# Patient Record
Sex: Female | Born: 1940 | Race: White | Hispanic: No | Smoking: Never smoker
Health system: Southern US, Community
[De-identification: ages and names within clinical notes are randomized; demographics above are authoritative.]

## PROBLEM LIST (undated history)

## (undated) DIAGNOSIS — G43909 Migraine, unspecified, not intractable, without status migrainosus: Secondary | ICD-10-CM

## (undated) DIAGNOSIS — R519 Headache, unspecified: Secondary | ICD-10-CM

## (undated) DIAGNOSIS — R51 Headache: Secondary | ICD-10-CM

## (undated) HISTORY — PX: CATARACT EXTRACTION: SUR2

## (undated) HISTORY — DX: Migraine, unspecified, not intractable, without status migrainosus: G43.909

## (undated) HISTORY — DX: Headache: R51

## (undated) HISTORY — DX: Headache, unspecified: R51.9

---
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
## 1999-04-17 ENCOUNTER — Other Ambulatory Visit: Admission: RE | Admit: 1999-04-17 | Discharge: 1999-04-17 | Payer: Self-pay | Admitting: Family Medicine

## 2014-04-29 ENCOUNTER — Emergency Department: Payer: Self-pay | Admitting: Emergency Medicine

## 2014-04-29 LAB — CBC WITH DIFFERENTIAL/PLATELET
BASOS ABS: 0 10*3/uL (ref 0.0–0.1)
BASOS PCT: 0.1 %
EOS PCT: 0.6 %
Eosinophil #: 0.1 10*3/uL (ref 0.0–0.7)
HCT: 36.2 % (ref 35.0–47.0)
HGB: 12.2 g/dL (ref 12.0–16.0)
Lymphocyte #: 0.7 10*3/uL — ABNORMAL LOW (ref 1.0–3.6)
Lymphocyte %: 8.5 %
MCH: 31.6 pg (ref 26.0–34.0)
MCHC: 33.7 g/dL (ref 32.0–36.0)
MCV: 94 fL (ref 80–100)
MONO ABS: 0.4 x10 3/mm (ref 0.2–0.9)
Monocyte %: 4.6 %
Neutrophil #: 7.5 10*3/uL — ABNORMAL HIGH (ref 1.4–6.5)
Neutrophil %: 86.2 %
Platelet: 250 10*3/uL (ref 150–440)
RBC: 3.86 10*6/uL (ref 3.80–5.20)
RDW: 13.2 % (ref 11.5–14.5)
WBC: 8.7 10*3/uL (ref 3.6–11.0)

## 2014-04-29 LAB — COMPREHENSIVE METABOLIC PANEL
AST: 21 U/L (ref 15–37)
Albumin: 3.5 g/dL (ref 3.4–5.0)
Alkaline Phosphatase: 73 U/L
Anion Gap: 6 — ABNORMAL LOW (ref 7–16)
BUN: 10 mg/dL (ref 7–18)
Bilirubin,Total: 0.4 mg/dL (ref 0.2–1.0)
CALCIUM: 8.8 mg/dL (ref 8.5–10.1)
CREATININE: 0.73 mg/dL (ref 0.60–1.30)
Chloride: 101 mmol/L (ref 98–107)
Co2: 23 mmol/L (ref 21–32)
EGFR (Non-African Amer.): >60
Glucose: 125 mg/dL — ABNORMAL HIGH (ref 65–99)
Osmolality: 261 (ref 275–301)
Potassium: 4 mmol/L (ref 3.5–5.1)
SGPT (ALT): 25 U/L (ref 12–78)
Sodium: 130 mmol/L — ABNORMAL LOW (ref 136–145)
TOTAL PROTEIN: 6.7 g/dL (ref 6.4–8.2)

## 2014-04-29 LAB — MAGNESIUM: MAGNESIUM: 1.7 mg/dL — AB

## 2014-04-29 LAB — SEDIMENTATION RATE: ERYTHROCYTE SED RATE: 7 mm/h (ref 0–30)

## 2014-04-29 LAB — LIPASE, BLOOD: LIPASE: 147 U/L (ref 73–393)

## 2014-04-30 LAB — URINALYSIS, COMPLETE
Bilirubin,UR: NEGATIVE
GLUCOSE, UR: NEGATIVE mg/dL (ref 0–75)
NITRITE: NEGATIVE
Ph: 6 (ref 4.5–8.0)
Protein: 30
SPECIFIC GRAVITY: 1.013 (ref 1.003–1.030)

## 2015-04-21 IMAGING — CT CT HEAD WITHOUT CONTRAST
1 series · 16 of 28 positions shown, 20 images · non-contrast
Comparison: None.

CLINICAL DATA: 72-year-old female with severe headache.

EXAM:
CT HEAD WITHOUT CONTRAST
TECHNIQUE: Contiguous axial images were obtained from the base of the skull
through the vertex without intravenous contrast.

[Series 2: head wo · axial · 0.42mm/px · z∈[-179,-66]mm · 16 of 28 slices shown, 20 images]
[im 2/28  brain]
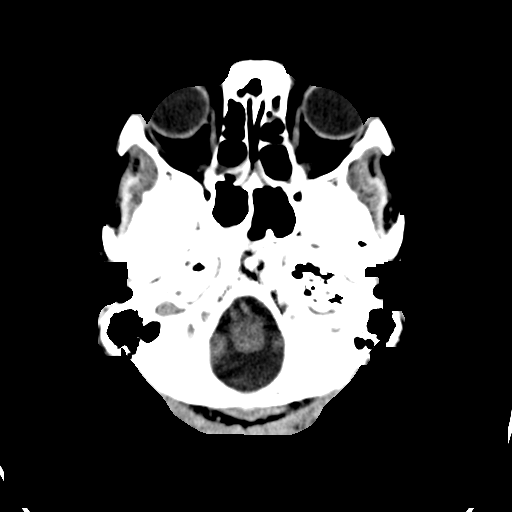
[im 2/28  bone]
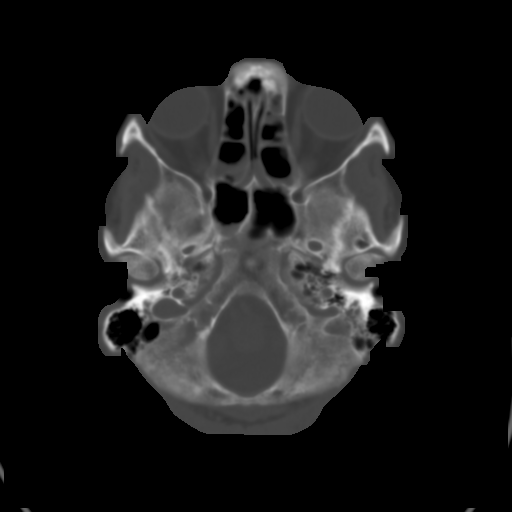
[im 4/28  brain]
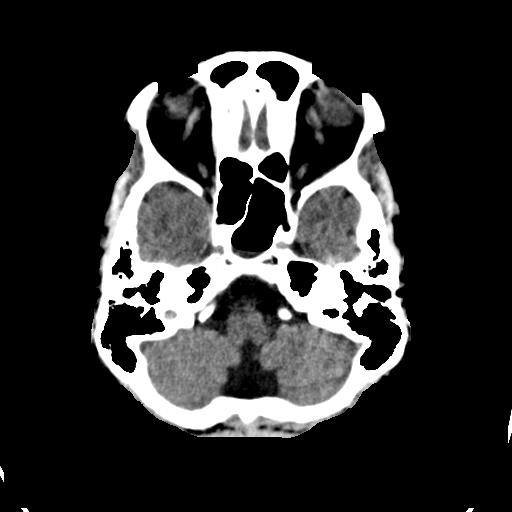
[im 6/28  brain]
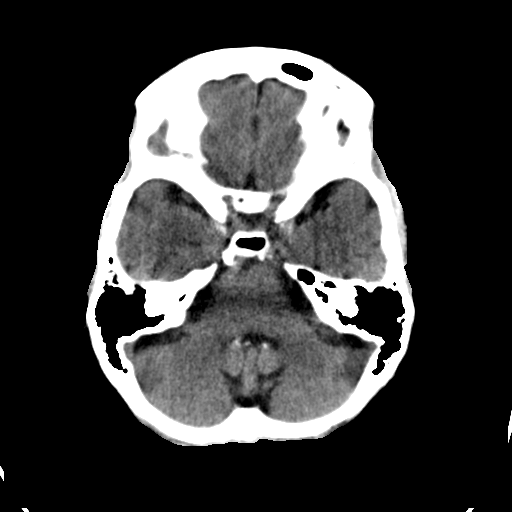
[im 7/28  brain]
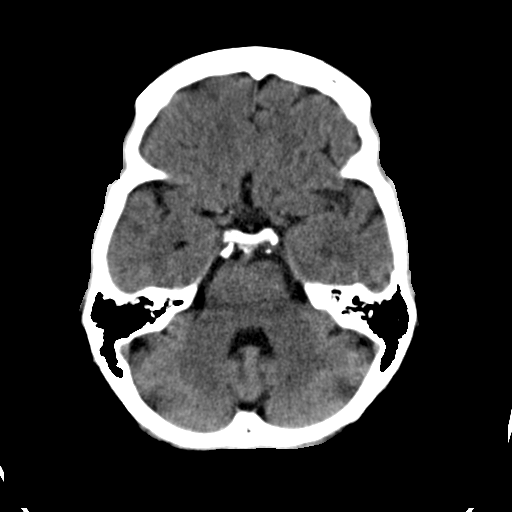
[im 9/28  brain]
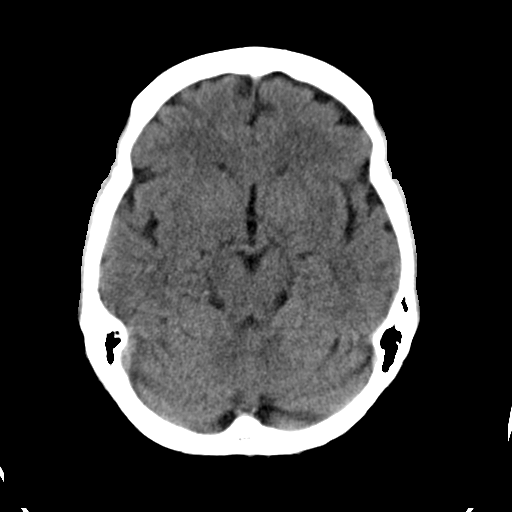
[im 9/28  bone]
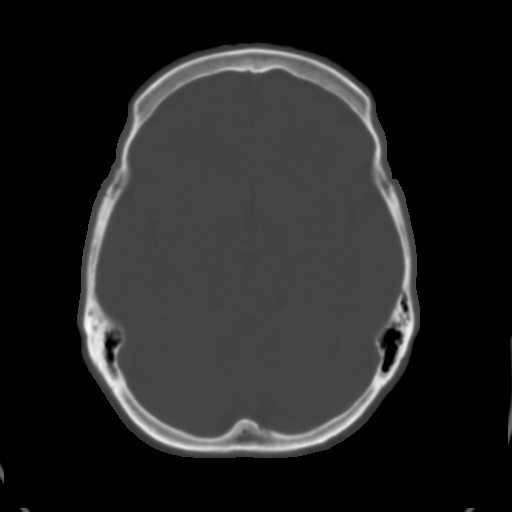
[im 10/28  brain]
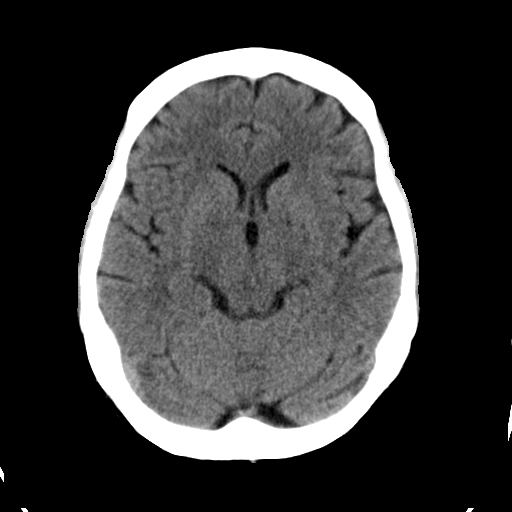
[im 12/28  brain]
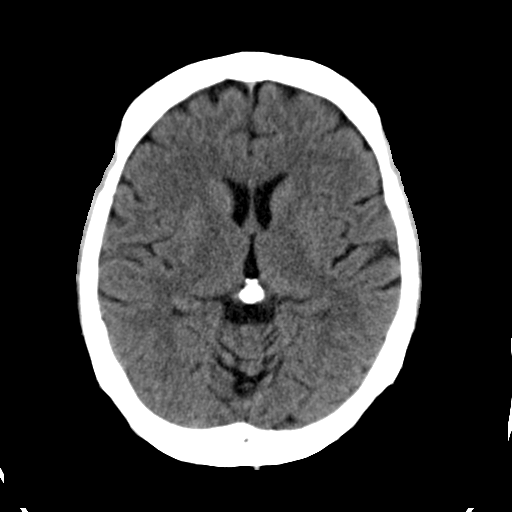
[im 14/28  brain]
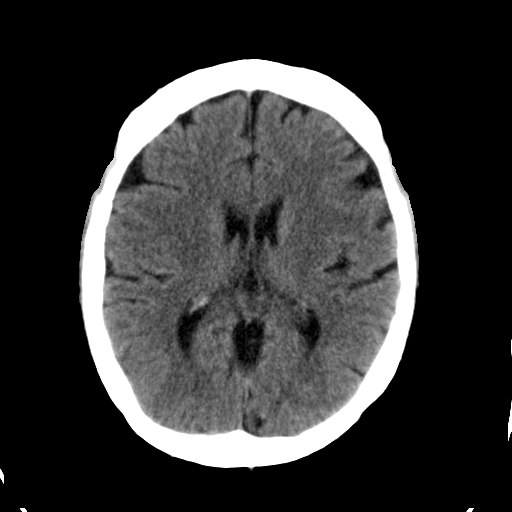
[im 15/28  brain]
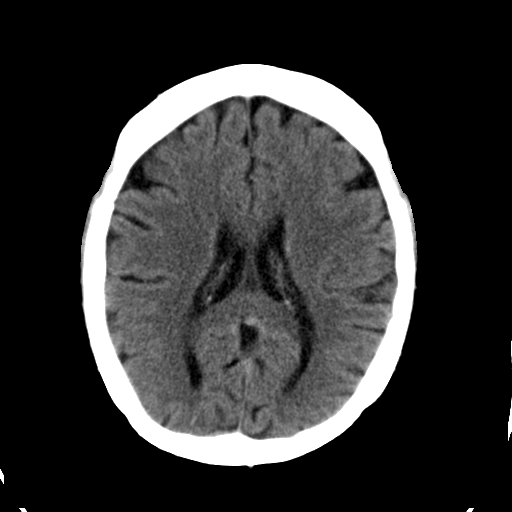
[im 15/28  bone]
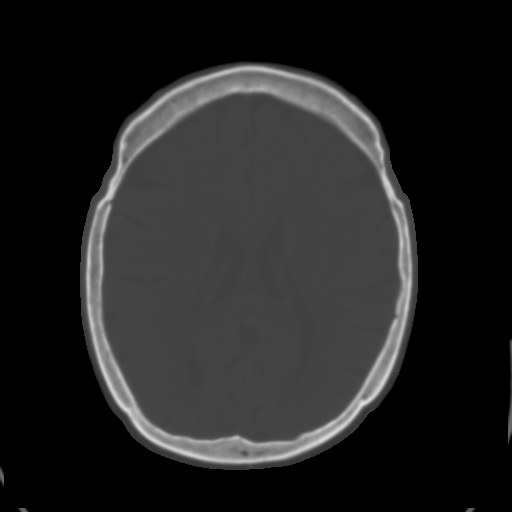
[im 17/28  brain]
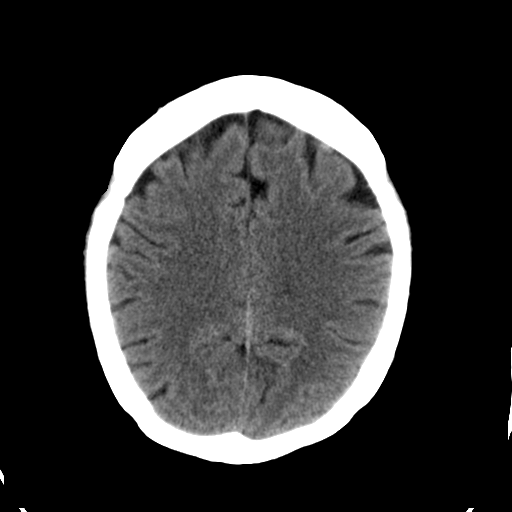
[im 19/28  brain]
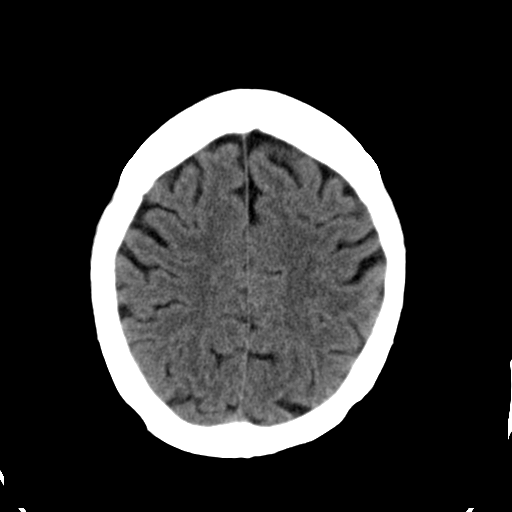
[im 20/28  brain]
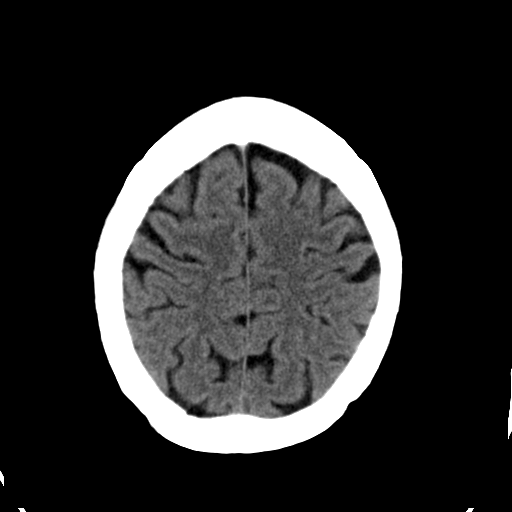
[im 22/28  brain]
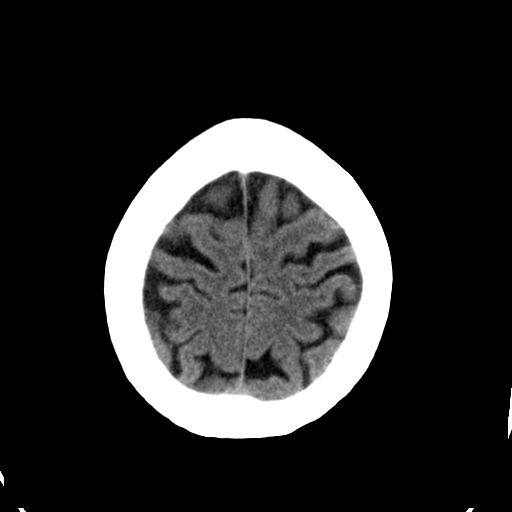
[im 22/28  bone]
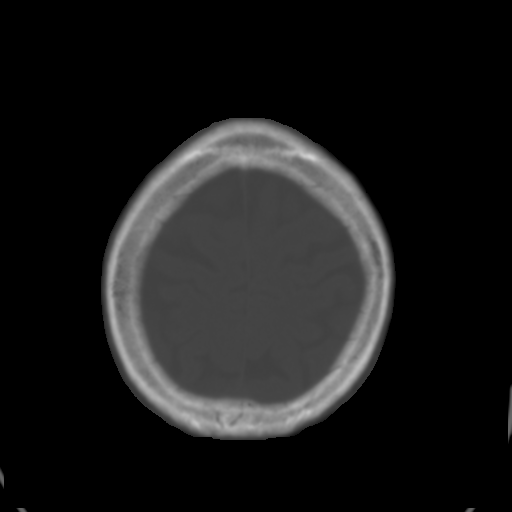
[im 23/28  brain]
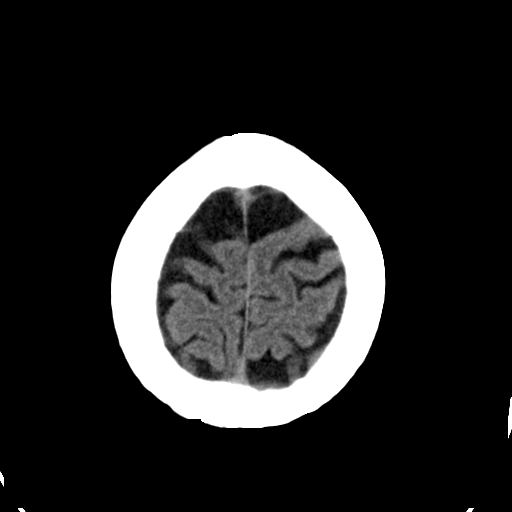
[im 25/28  brain]
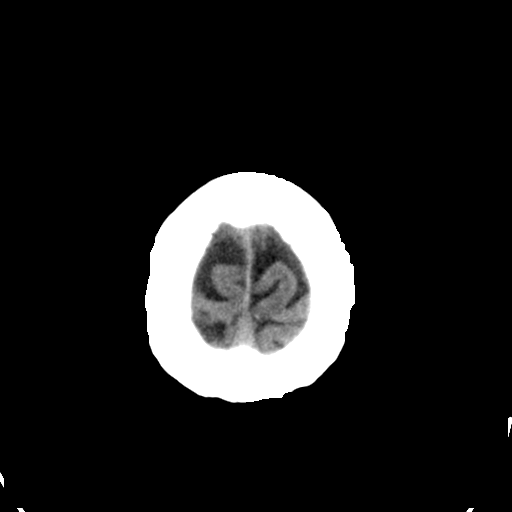
[im 27/28  brain]
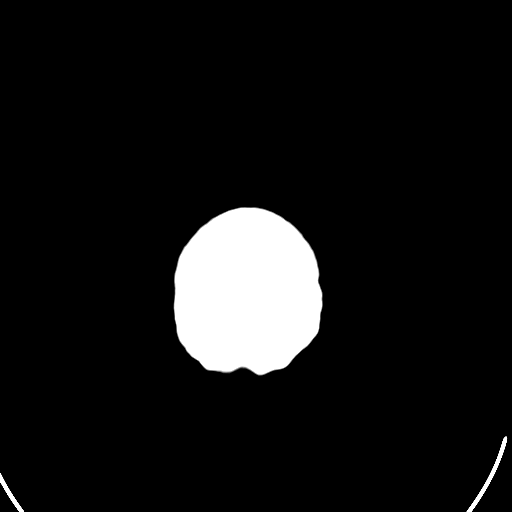

[16 of 28 positions shown; findings below may reference images not displayed]

FINDINGS: No intracranial abnormalities are identified, including mass lesion
or mass effect, hydrocephalus, extra-axial fluid collection, midline
shift, hemorrhage, or acute infarction.

The visualized bony calvarium is unremarkable.

Fluid in scattered ethmoid air cells and in the sphenoid sinus is
noted.
IMPRESSION: No evidence of intracranial abnormality.

Fluid within the sphenoid and ethmoid sinuses which may represent
acute sinusitis.

## 2016-05-08 DIAGNOSIS — G44209 Tension-type headache, unspecified, not intractable: Secondary | ICD-10-CM | POA: Diagnosis not present

## 2016-05-08 DIAGNOSIS — H6120 Impacted cerumen, unspecified ear: Secondary | ICD-10-CM | POA: Diagnosis not present

## 2016-05-08 DIAGNOSIS — H6981 Other specified disorders of Eustachian tube, right ear: Secondary | ICD-10-CM | POA: Diagnosis not present

## 2016-05-15 DIAGNOSIS — G44209 Tension-type headache, unspecified, not intractable: Secondary | ICD-10-CM | POA: Diagnosis not present

## 2016-05-15 DIAGNOSIS — G43909 Migraine, unspecified, not intractable, without status migrainosus: Secondary | ICD-10-CM | POA: Diagnosis not present

## 2016-05-15 DIAGNOSIS — R5381 Other malaise: Secondary | ICD-10-CM | POA: Diagnosis not present

## 2016-05-15 DIAGNOSIS — J449 Chronic obstructive pulmonary disease, unspecified: Secondary | ICD-10-CM | POA: Diagnosis not present

## 2016-05-15 DIAGNOSIS — E7800 Pure hypercholesterolemia, unspecified: Secondary | ICD-10-CM | POA: Diagnosis not present

## 2016-05-15 DIAGNOSIS — I10 Essential (primary) hypertension: Secondary | ICD-10-CM | POA: Diagnosis not present

## 2016-05-15 DIAGNOSIS — H6981 Other specified disorders of Eustachian tube, right ear: Secondary | ICD-10-CM | POA: Diagnosis not present

## 2016-05-15 DIAGNOSIS — E559 Vitamin D deficiency, unspecified: Secondary | ICD-10-CM | POA: Diagnosis not present

## 2016-05-16 ENCOUNTER — Other Ambulatory Visit: Payer: Self-pay | Admitting: Nurse Practitioner

## 2016-05-16 ENCOUNTER — Ambulatory Visit
Admission: RE | Admit: 2016-05-16 | Discharge: 2016-05-16 | Disposition: A | Discharge status: Home / Self Care | Payer: Medicare Other | Source: Ambulatory Visit | Attending: Nurse Practitioner | Admitting: Nurse Practitioner

## 2016-05-16 DIAGNOSIS — R059 Cough, unspecified: Secondary | ICD-10-CM

## 2016-05-16 DIAGNOSIS — R05 Cough: Secondary | ICD-10-CM

## 2017-05-08 IMAGING — CR DG CHEST 2V
2 series · 2 of 2 positions shown · non-contrast
Comparison: None in PACs

CLINICAL DATA: Three weeks of cough without fever common nonsmoker.

EXAM:
CHEST  2 VIEW

[w chest pa]
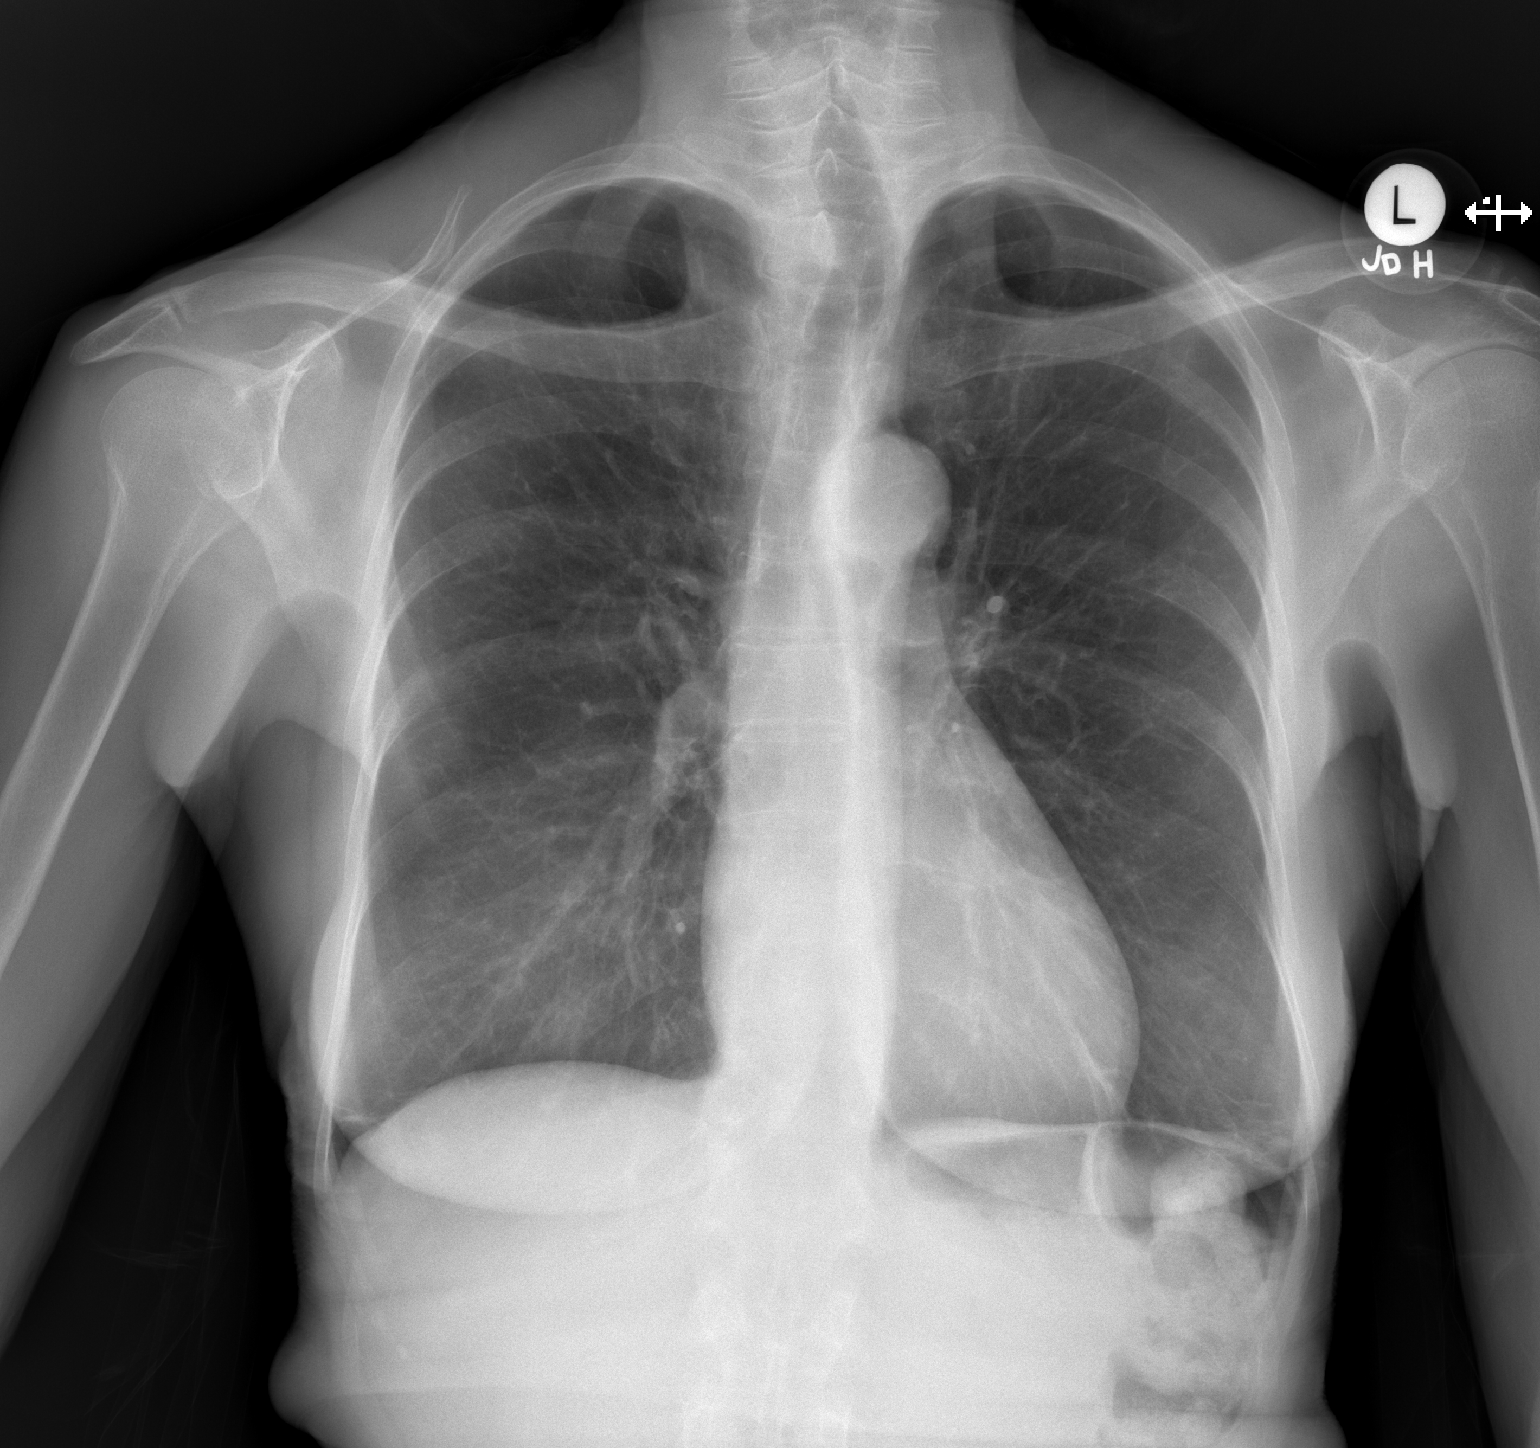

[w chest lat]
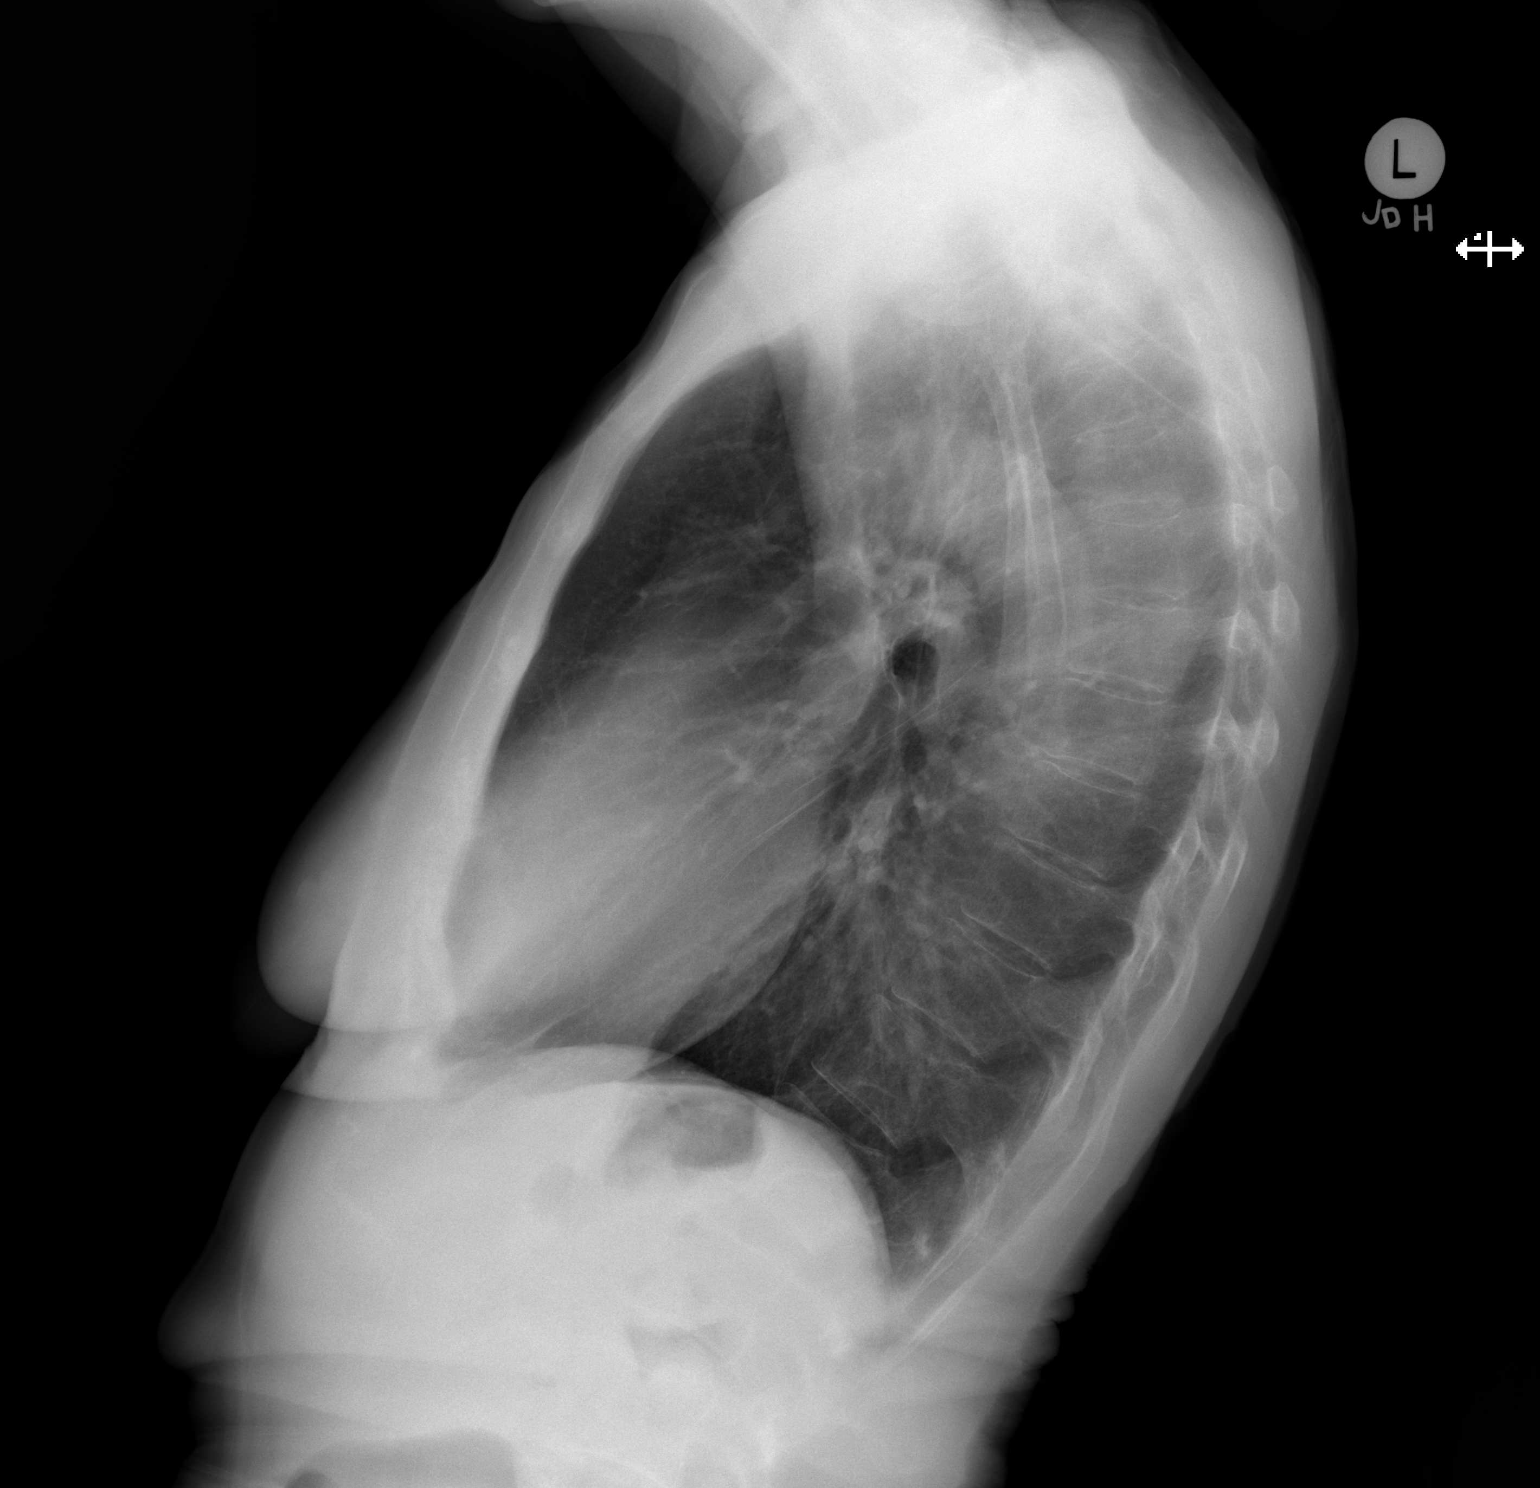

[2 of 2 positions shown; findings below may reference images not displayed]

FINDINGS: The lungs are mildly hyperinflated. There is no focal infiltrate.
There is minimal linear density just above the lateral costophrenic
angles bilaterally. The heart and pulmonary vascularity are normal.
The mediastinum is normal in width. There is mild deviation of the
trachea toward the left above the clavicles. There is mild
multilevel degenerative disc space narrowing of the thoracic spine.
IMPRESSION: COPD-reactive airway disease. Minimal subsegmental atelectasis or
scarring at both lung bases. No alveolar pneumonia, CHF, nor other
acute cardiopulmonary disease.

Possible goiter or lymphadenopathy deviating the trachea toward the
left. Thyroid ultrasound is recommended.

## 2017-06-05 ENCOUNTER — Ambulatory Visit (INDEPENDENT_AMBULATORY_CARE_PROVIDER_SITE_OTHER): Payer: Medicare Other | Admitting: Primary Care

## 2017-06-05 ENCOUNTER — Encounter: Payer: Self-pay | Admitting: Primary Care

## 2017-06-05 DIAGNOSIS — G43709 Chronic migraine without aura, not intractable, without status migrainosus: Secondary | ICD-10-CM

## 2017-06-05 DIAGNOSIS — IMO0002 Reserved for concepts with insufficient information to code with codable children: Secondary | ICD-10-CM | POA: Insufficient documentation

## 2017-06-05 NOTE — Progress Notes (Signed)
   Subjective:    Patient ID: Kim Potts, female    DOB: 01-25-1941, 76 y.o.   MRN: [id number]  HPI  Kim Potts is a 76 year old female who presents today to establish care and discuss the problems mentioned below. Will obtain old records.  1) Chronic Migraines: Present since age 76. She's tried numerous prescription strength (Imitrex-nasal, can't remember others) medications without any improvement. History of hospitalizations due to migraines. Migraines occur once-twice monthly on average. Her migraines are typically located to the parietal lobes. She denies aura. She will experience photophobia, phonophobia, and nausea during migraines.   Currently managed on butalbital-APAP-Caffeine 50-300-40 mg capsules. This is the only medication that has helped. She will take two capsules with relief. She typically takes Ibuprofen for a minor headache, and then one of her butalbital capsules when she feels the headache progress to a migraine.   Review of Systems  Respiratory: Negative for shortness of breath.   Cardiovascular: Negative for chest pain.  Neurological:       Chronic migraines       Past Medical History:  Diagnosis Date  . Frequent headaches   . Migraines      Social History   Social History  . Marital status: Married    Spouse name: N/A  . Number of children: N/A  . Years of education: N/A   Occupational History  . Not on file.   Social History Main Topics  . Smoking status: Never Smoker  . Smokeless tobacco: Never Used  . Alcohol use Yes  . Drug use: Unknown  . Sexual activity: Not on file   Other Topics Concern  . Not on file   Social History Narrative   Widowed.   3 children, 1 step-child. 4 grandchildren, 4 step-grandchildren.   Currently working as a Runner, broadcasting/film/videoteacher.   Enjoys reading.    Past Surgical History:  Procedure Laterality Date  . APPENDECTOMY  1946    Family History  Problem Relation Age of Onset  . Diabetes Mother     No Known  Allergies  No current outpatient prescriptions on file prior to visit.   No current facility-administered medications on file prior to visit.     BP 124/76   Pulse 78   Temp 97.8 F (36.6 C) (Oral)   Ht 5' 3.5" (1.613 m)   Wt 116 lb 12.8 oz (53 kg)   SpO2 97%   BMI 20.37 kg/m    Objective:   Physical Exam  Constitutional: She is oriented to person, place, and time. She appears well-nourished.  Eyes: EOM are normal.  Neck: Neck supple.  Cardiovascular: Normal rate and regular rhythm.   Pulmonary/Chest: Effort normal and breath sounds normal.  Neurological: She is alert and oriented to person, place, and time. No cranial nerve deficit.  Skin: Skin is warm and dry.  Psychiatric: She has a normal mood and affect.          Assessment & Plan:

## 2017-06-05 NOTE — Assessment & Plan Note (Signed)
Intermittent since age 76. Doing well on butalbital-APAP-caffeine. Discussed addictive nature of this medication and to use sparingly. Will get controlled substance contact today. Will refill once she runs low on last refill. Neuro exam unremarkable.

## 2017-06-05 NOTE — Patient Instructions (Signed)
Use the butalbital-APAP-Caffeine tablets sparingly for migraines as discussed.  Please call me when you are running low on your last refill.   Stop by the lab to sign the controlled substance contract.  It was a pleasure to meet you today! Please don't hesitate to call me with any questions. Welcome to [facility]!

## 2017-06-09 ENCOUNTER — Other Ambulatory Visit: Payer: Self-pay | Admitting: Primary Care

## 2017-06-09 DIAGNOSIS — G43701 Chronic migraine without aura, not intractable, with status migrainosus: Secondary | ICD-10-CM

## 2017-06-09 NOTE — Telephone Encounter (Signed)
Called in medication to the pharmacy as instructed. 

## 2017-06-09 NOTE — Telephone Encounter (Signed)
Ok to refill? Electronically refill request for Butalbital-APAP-Caffeine 50-300-40 MG CAPS.   Medication have not been prescribed by Jae DireKate. Est care appt on 06/05/2017

## 2017-06-09 NOTE — Telephone Encounter (Signed)
Approved, please phone in as listed in chart. Please ask patient how often she requires use of this medication, I meant to ask her last time. Once weekly? Once monthly? How often?

## 2017-06-09 NOTE — Telephone Encounter (Signed)
Message left for patient to return my call.  

## 2017-06-10 NOTE — Telephone Encounter (Signed)
Sean at [facility] called for quantity of butalbital-APAP-caffeine that was called in on 06/09/17; advised Sean # 30 x 0 refills; Gregary SignsSean voiced understanding and nothing further needed.

## 2017-06-11 NOTE — Telephone Encounter (Signed)
Spoken to patient. She stated that it really depends on the stress level. She usually take this once a month but at times, it may twice that month.

## 2017-06-11 NOTE — Telephone Encounter (Signed)
Noted  

## 2017-08-19 ENCOUNTER — Encounter: Payer: Self-pay | Admitting: Primary Care

## 2017-08-19 ENCOUNTER — Ambulatory Visit (INDEPENDENT_AMBULATORY_CARE_PROVIDER_SITE_OTHER): Payer: Medicare Other | Admitting: Primary Care

## 2017-08-19 VITALS — BP 118/72 | HR 75 | Temp 98.1°F | Ht 63.5 in | Wt 116.4 lb

## 2017-08-19 DIAGNOSIS — J302 Other seasonal allergic rhinitis: Secondary | ICD-10-CM

## 2017-08-19 DIAGNOSIS — Z23 Encounter for immunization: Secondary | ICD-10-CM

## 2017-08-19 NOTE — Patient Instructions (Signed)
Continue Dayquil and Nyquil as needed.  Ensure you are staying hydrated with water and rest.  It was a pleasure to see you today!

## 2017-08-19 NOTE — Progress Notes (Signed)
   Subjective:    Patient ID: Kim Potts, female    DOB: 1941-04-27, 76 y.o.   MRN: [id number]  HPI  Kim Potts is a 76 year old female who presents today with a chief complaint of voice hoarseness, scratchy throat, and cough. Her symptoms began 1-2 days ago. She's taken Nyquil and Dayquil with improvement. She denies fevers and is feeling well otherwise.   She was calling to schedule her flu vaccination and was told she had to see her PCP given her symptoms.   Review of Systems  Constitutional: Negative for chills, fatigue and fever.  HENT: Positive for voice change. Negative for congestion, ear pain, postnasal drip and sore throat.   Respiratory: Positive for cough.   Cardiovascular: Negative for chest pain.  Allergic/Immunologic: Positive for environmental allergies.       Past Medical History:  Diagnosis Date  . Frequent headaches   . Migraines      Social History   Social History  . Marital status: Married    Spouse name: N/A  . Number of children: N/A  . Years of education: N/A   Occupational History  . Not on file.   Social History Main Topics  . Smoking status: Never Smoker  . Smokeless tobacco: Never Used  . Alcohol use Yes  . Drug use: Unknown  . Sexual activity: Not on file   Other Topics Concern  . Not on file   Social History Narrative   Widowed.   3 children, 1 step-child. 4 grandchildren, 4 step-grandchildren.   Currently working as a Runner, broadcasting/film/video.   Enjoys reading.    Past Surgical History:  Procedure Laterality Date  . APPENDECTOMY  1946    Family History  Problem Relation Age of Onset  . Diabetes Mother     No Known Allergies  Current Outpatient Prescriptions on File Prior to Visit  Medication Sig Dispense Refill  . Butalbital-APAP-Caffeine 50-300-40 MG CAPS Take 1-2 capsule by mouth every 6 hours as needed for headache. Use sparingly. 30 capsule 0   No current facility-administered medications on file prior to visit.      BP 118/72   Pulse 75   Temp 98.1 F (36.7 C) (Oral)   Ht 5' 3.5" (1.613 m)   Wt 116 lb 6.4 oz (52.8 kg)   SpO2 97%   BMI 20.30 kg/m    Objective:   Physical Exam  Constitutional: She appears well-nourished.  HENT:  Right Ear: Tympanic membrane and ear canal normal.  Left Ear: Tympanic membrane and ear canal normal.  Nose: Right sinus exhibits no maxillary sinus tenderness and no frontal sinus tenderness. Left sinus exhibits no maxillary sinus tenderness and no frontal sinus tenderness.  Mouth/Throat: Oropharynx is clear and moist.  Eyes: Conjunctivae are normal.  Neck: Neck supple.  Cardiovascular: Normal rate and regular rhythm.   Pulmonary/Chest: Effort normal and breath sounds normal. She has no wheezes. She has no rales.  Lymphadenopathy:    She has no cervical adenopathy.  Skin: Skin is warm and dry.          Assessment & Plan:  Allergic Rhinitis vs URI:  Scratchy throat with voice change x 24 hours. Overall feeling well, no fevers. Exam unremarkable. Approve influenza vaccination given lack of fever and normal exam.   Morrie Sheldon, NP

## 2017-08-19 NOTE — Addendum Note (Signed)
Addended by: Tawnya Crook on: 08/19/2017 04:26 PM   Modules accepted: Orders

## 2018-04-24 DIAGNOSIS — H2510 Age-related nuclear cataract, unspecified eye: Secondary | ICD-10-CM | POA: Diagnosis not present

## 2019-06-10 ENCOUNTER — Other Ambulatory Visit: Payer: Self-pay

## 2019-08-07 DIAGNOSIS — Z23 Encounter for immunization: Secondary | ICD-10-CM | POA: Diagnosis not present

## 2020-05-23 DIAGNOSIS — H2510 Age-related nuclear cataract, unspecified eye: Secondary | ICD-10-CM | POA: Diagnosis not present

## 2020-08-13 DIAGNOSIS — Z23 Encounter for immunization: Secondary | ICD-10-CM | POA: Diagnosis not present

## 2020-11-09 ENCOUNTER — Ambulatory Visit (INDEPENDENT_AMBULATORY_CARE_PROVIDER_SITE_OTHER): Payer: Self-pay | Admitting: Plastic Surgery

## 2020-11-09 ENCOUNTER — Other Ambulatory Visit: Payer: Self-pay

## 2020-11-09 ENCOUNTER — Encounter: Payer: Self-pay | Admitting: Plastic Surgery

## 2020-11-09 VITALS — BP 183/77 | HR 60

## 2020-11-09 DIAGNOSIS — Z411 Encounter for cosmetic surgery: Secondary | ICD-10-CM

## 2020-11-09 MED ORDER — BUTALBITAL-APAP-CAFFEINE 50-325-40 MG PO TABS: 1.0000 | ORAL_TABLET | Freq: Four times a day (QID) | ORAL | 0 refills | Status: AC | PRN

## 2020-11-09 NOTE — Progress Notes (Signed)
Patient presents for cosmetic visit.  She is bothered by the dynamic and static lines in the forehead, glabella and crows feet area.  She has gotten Botox for this in the past and is interested in further treatment.  She is also bothered by the infraorbital area in the jowl and neck area as well.  She has had a mini facelift, upper blepharoplasty, and lower blepharoplasty done by Dr. Stephens November.  She feels like over the past 10 years or so her skin laxity in the setting has recurred for the jowl area.  For the meantime she is mainly interested in doing Botox and wants to hold off on doing any other therapies for various reasons.  The face was prepped with an alcohol pad and 40 units of Botox were distributed between the crows feet, glabella and forehead.  She tolerated this fine.  Patient reports intermittent migraine headaches that are fairly debilitating for her.  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  She normally gets this from her primary care provider.  She says she is almost out of this medication and is requesting a refill.  I explained I could provide a short-term supply but this would need to be filled down the line from her primary care physician or neurologist.  She is understanding and will see her at her next visit.

## 2021-02-22 ENCOUNTER — Other Ambulatory Visit: Payer: Self-pay

## 2021-02-22 ENCOUNTER — Encounter: Payer: Self-pay | Admitting: Plastic Surgery

## 2021-02-22 ENCOUNTER — Ambulatory Visit (INDEPENDENT_AMBULATORY_CARE_PROVIDER_SITE_OTHER): Payer: Self-pay | Admitting: Plastic Surgery

## 2021-02-22 VITALS — BP 182/86 | HR 68

## 2021-02-22 DIAGNOSIS — Z411 Encounter for cosmetic surgery: Secondary | ICD-10-CM
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
## 2021-05-17 ENCOUNTER — Ambulatory Visit (INDEPENDENT_AMBULATORY_CARE_PROVIDER_SITE_OTHER): Payer: Self-pay | Admitting: Plastic Surgery

## 2021-05-17 ENCOUNTER — Other Ambulatory Visit: Payer: Self-pay

## 2021-05-17 DIAGNOSIS — Z411 Encounter for cosmetic surgery: Secondary | ICD-10-CM

## 2021-05-17 NOTE — Progress Notes (Signed)
Patient presents to discuss additional neuromodulator treatment.  We used 120 units of Dysport last time and she liked the effects of that.  We also did filler to the nasolabial folds and she was happy with that as well.  Her exam is unchanged from prior.  We discussed the risks and benefits of neuromodulator treatment and she is interested in moving forward.  The forehead, glabella and crows feet were prepped with an alcohol pad and 120 units of Dysport will infiltrated throughout these areas.  She tolerated this fine.  Patient also wanted to discuss upper lid blepharoplasty.  She did have a blepharoplasty in the past by Dr. Stephens November.  She feels like the skin redundancy has increased.  She also wants to discuss a facelift and neck lift.  She has had a mini facelift in the past but again feels like the result has diminished over time time.  On exam she has mild to moderate dermatochalasis of both upper lids.  Brow position is good.  Upper lid margin position is also appropriate and she seems to have reasonable levator function.  Eyelid margin position is symmetric.  She does have some bulging of the nasal fat pad.  She has some prominence of the junction between her lower lid and cheek and mild prominence of the nasolabial folds.  She does have skin redundancy and platysmal banding present in the jowl and neck area.  I can see her previous scar from her last facelift but it has healed well.  Cranial nerves seem to be intact.  I do think she is a reasonable candidate for bilateral upper lid blepharoplasty with face and neck lift.  I could also do some fat grafting to the nasolabial folds and malar area which I think would help her.  We discussed the details of this procedure along with the location and orientation of the scars.  We discussed the risks and benefits that include bleeding, infection, damage to surrounding structures and need for additional procedures.  All of her questions were answered and we will  plan to move forward.  She would like to have this done in the wintertime.

## 2021-09-13 ENCOUNTER — Ambulatory Visit (INDEPENDENT_AMBULATORY_CARE_PROVIDER_SITE_OTHER): Payer: Self-pay | Admitting: Surgical

## 2021-09-13 ENCOUNTER — Encounter: Payer: Self-pay | Admitting: Surgical

## 2021-09-13 ENCOUNTER — Other Ambulatory Visit: Payer: Self-pay

## 2021-09-13 DIAGNOSIS — Z411 Encounter for cosmetic surgery: Secondary | ICD-10-CM

## 2021-09-13 NOTE — Progress Notes (Addendum)
Botulinum Toxin Procedure Note  Procedure: Cosmetic Dysport  Pre-operative Diagnosis: Dynamic rhytides  Post-operative Diagnosis: Same  Complications:  None  Brief history: The patient desires Dysport injection of her dynamic and static rhytids. I discussed with the patient this proposed procedure of botulinum toxin injections, which is customized depending on the particular needs of the patient. It is performed on facial rhytids as a temporary correction. The risks were addressed including bleeding, scarring, infection, damage to deeper structures, asymmetry, and chronic pain, which may occur infrequently after a procedure. Other risks include unsatisfactory results, brow ptosis, eyelid ptosis, allergic reaction, temporary paralysis, which should go away with time, bruising, blurring disturbances and delayed healing. The patient understands and wishes to proceed.  Procedure: The area was prepped with alcohol and dried with a clean gauze. Using a clean technique, the dysport was diluted with 2.5 cc of preservative-free normal saline which was slowly injected with an 18 gauge needle in a tuberculin syringes.  A 32 gauge needles were then used to inject the botulinum toxin. This mixture allow for an aliquot of 12 units per 0.1 cc in each injection site.    Subsequently the mixture was injected in the glabellar and forehead area with preservation of the temporal branch to the lateral eyebrow as well as into each lateral canthal area beginning from the lateral orbital rim medial to the zygomaticus major in 3 separate areas. A total of 120 Units of Dysport was used. The forehead and glabellar area was injected with care to inject intramuscular only while holding pressure on the supratrochlear vessels in each area during each injection on either side of the medial corrugators. The injection proceeded vertically superiorly to the medial 2/3 of the frontalis muscle and superior 2/3 of the lateral frontalis,  again with preservation of the frontal branch.  No complications were noted.

## 2021-12-26 ENCOUNTER — Ambulatory Visit (INDEPENDENT_AMBULATORY_CARE_PROVIDER_SITE_OTHER): Payer: Self-pay | Admitting: Plastic Surgery

## 2021-12-26 ENCOUNTER — Other Ambulatory Visit: Payer: Self-pay

## 2021-12-26 DIAGNOSIS — Z411 Encounter for cosmetic surgery: Secondary | ICD-10-CM

## 2021-12-26 NOTE — Progress Notes (Signed)
Patient presents to discuss additional neuromodulator treatment.  Last time we did 120 units of Dysport and she is like this dosage in the past.  We reviewed the risks and benefits of neuromodulator treatment and she is interested in moving forward.  The forehead, glabella and crows feet were prepped with an alcohol pad and 120 units of Dysport were distributed throughout.  I used 3 injection points in the crows feet and 2 rows for the forehead.  She tolerated this fine.  She is also interested in laser resurfacing and we discussed the halo laser for her I think she is a great candidate for it.  We will plan to get her some information on this.  All of her questions were answered.

## 2022-02-27 ENCOUNTER — Encounter: Payer: Medicare Other | Admitting: Plastic Surgery

## 2022-03-20 ENCOUNTER — Ambulatory Visit (INDEPENDENT_AMBULATORY_CARE_PROVIDER_SITE_OTHER): Payer: Self-pay | Admitting: Plastic Surgery

## 2022-03-20 ENCOUNTER — Encounter: Payer: Self-pay | Admitting: Plastic Surgery

## 2022-03-20 DIAGNOSIS — Z411 Encounter for cosmetic surgery: Secondary | ICD-10-CM

## 2022-03-20 NOTE — Progress Notes (Signed)
Patient presents to discuss neuromodulator treatment.  Last time we did 120 units of Dysport throughout the forehead, glabella and crows feet.  She liked the result from this and would like to do the scan.  We reviewed the risks and benefits of neuromodulator injection she is interested in moving forward.  The forehead, glabella and crows feet were prepped and alcohol pad under 20 units of Dysport were injected with standard injection pattern using 3 injection points for the crows feet and 2 rows for the forehead.  She tolerated this fine.  We will plan to see her at her next visit.  All of her questions were answered.  She is interested in doing the halo laser in June and I will try to set her up with [facility] for that. ?

## 2022-04-19 ENCOUNTER — Telehealth: Payer: Self-pay | Admitting: Surgical

## 2022-04-19 ENCOUNTER — Other Ambulatory Visit (HOSPITAL_COMMUNITY): Payer: Self-pay

## 2022-04-19 MED ORDER — LIDOCAINE 23% - TETRACAINE 7% TOPICAL OINTMENT (PLASTICIZED)
1.0000 "application " | TOPICAL_OINTMENT | Freq: Once | CUTANEOUS | 0 refills | Status: AC
  Filled 2022-04-19: qty 60, 2d supply, fill #0

## 2022-04-19 NOTE — Telephone Encounter (Signed)
Called patient to discuss upcoming halo laser, discussed appointment arrival time, discussed sent prescription for numbing medication to [facility] outpatient pharmacy.

## 2022-04-22 ENCOUNTER — Other Ambulatory Visit (HOSPITAL_COMMUNITY): Payer: Self-pay

## 2022-04-24 ENCOUNTER — Ambulatory Visit (INDEPENDENT_AMBULATORY_CARE_PROVIDER_SITE_OTHER): Payer: Self-pay | Admitting: Surgical

## 2022-04-24 ENCOUNTER — Encounter: Payer: Medicare Other | Admitting: Plastic Surgery

## 2022-04-24 ENCOUNTER — Encounter: Payer: Self-pay | Admitting: Surgical

## 2022-04-24 ENCOUNTER — Other Ambulatory Visit: Payer: Medicare Other | Admitting: Plastic Surgery

## 2022-04-24 DIAGNOSIS — Z411 Encounter for cosmetic surgery: Secondary | ICD-10-CM

## 2022-04-24 MED ORDER — VALACYCLOVIR HCL 500 MG PO TABS: 500.0000 mg | ORAL_TABLET | Freq: Two times a day (BID) | ORAL | 0 refills | Status: AC

## 2022-04-24 NOTE — Progress Notes (Signed)
HALO Treatment   Treatment  Settings:       1470 nm : 500 microns @ 35 %     2940 nm : 50 microns @ 18 %      Zone  Area (cm2) Target Energy (Joules) Delivered Energy (J) 1470 Depth (Micron) 1470 Density (%) 2940 Depth (M) 2940 Density (%)  1 109 1314 1332 500 35.5 50 18.2  2 109 1314 1331 500 35.4 50 18.2  3 36 441 454 500 36 50 18.5  4 36 441 481 500 38.1 50 19.6  5 20  247 262 500 37.1 50 19.1   6 - Neck R 67  801 816 500 35.6 50 18.3   6  Neck L 67 801 807 500 35.3 50 18.1    Topical and/or Block: 23% lidocaine, 7% tetracaine ointment and Pro-nox 50% oxygen/50% nitrogen.  Prior to treatment we discussed risks and possible complications.  All of the patient's questions were answered to her content.  She felt very informed and reports her daughter had the same procedure at another office previously and did not have additional questions for me today.  Post Care: Patient was explicitly instructed on post care instructions, we discussed post care instructions including but not limited to use of ointments, avoiding sun exposure, use of sunscreen.   We also discussed expected postoperative outcomes and limitations of the halo laser.  She was aware of this.  We also discussed expected post procedure erythema, swelling and mends.  Would like patient to take Valtrex twice daily for 4 days post procedure to limit risk of cold sore/oral HSV outbreak.

## 2022-06-13 ENCOUNTER — Ambulatory Visit: Payer: Self-pay | Admitting: Surgical

## 2022-06-13 ENCOUNTER — Encounter: Payer: Self-pay | Admitting: Surgical

## 2022-06-13 DIAGNOSIS — Z411 Encounter for cosmetic surgery: Secondary | ICD-10-CM

## 2022-06-13 NOTE — Progress Notes (Signed)
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 I discussed with the patient this proposed procedure of botulinum toxin injections, which is customized depending on the particular needs of the patient. It is performed on facial rhytids as a temporary correction. The alternatives were discussed with the patient. The risks were addressed including bleeding, scarring, infection, damage to deeper structures, asymmetry, and chronic pain, which may occur infrequently after a procedure. The individual's choice to undergo a surgical procedure is based on the comparison of risks to potential benefits. Other risks include unsatisfactory results, brow ptosis, eyelid ptosis, allergic reaction, temporary paralysis, which should go away with time, bruising, blurring disturbances and delayed healing. Botulinum toxin injections do not arrest the aging process or produce permanent tightening of the eyelid.  Operative intervention maybe necessary to maintain the results of a blepharoplasty or botulinum toxin. The patient understands and wishes to proceed.  Procedure: The area was prepped with alcohol and dried with a clean gauze. Using a clean technique, the botulinum toxin was diluted with 2.5 cc of preservative-free normal saline which was slowly injected with an 18 gauge needle in a tuberculin syringes.  A 32 gauge needles were then used to inject the botulinum toxin. This mixture allow for an aliquot of 12 units per 0.1 cc in each injection site.    Subsequently the mixture was injected in the glabellar and forehead area with preservation of the temporal branch to the lateral eyebrow as well as into each lateral canthal area beginning from the lateral orbital rim medial to the zygomaticus  major in 3 separate areas. A total of 120 Units of botulinum toxin was used. The forehead and glabellar area was injected with care to inject intramuscular only while holding pressure on the supratrochlear vessels in each area during each injection on either side of the medial corrugators. The injection proceeded vertically superiorly to the medial 2/3 of the frontalis muscle and superior 2/3 of the lateral frontalis, again with preservation of the frontal branch.  No complications were noted. Light pressure was held for 5 minutes. She was instructed explicitly in post-operative care.  Dysport LOT:  [id number] EXP:  01/09/2024

## 2022-09-11 ENCOUNTER — Encounter: Payer: Self-pay | Admitting: Physician Assistant

## 2022-09-11 ENCOUNTER — Ambulatory Visit (INDEPENDENT_AMBULATORY_CARE_PROVIDER_SITE_OTHER): Payer: Self-pay | Admitting: Physician Assistant

## 2022-09-11 DIAGNOSIS — Z411 Encounter for cosmetic surgery: Secondary | ICD-10-CM

## 2022-09-11 NOTE — Progress Notes (Signed)
Botulinum Toxin Procedure Note   Procedure: Cosmetic botulinum toxin   Pre-operative Diagnosis: Dynamic rhytides   Post-operative Diagnosis: Same   Complications:  None   Brief history: The patient desires botulinum toxin injection.  She is aware of the risks including bleeding, damage to deeper structures, asymmetry, brow ptosis, eyelid ptosis, bruising. The patient understands and wishes to proceed.   Procedure: The area was prepped with alcohol and dried with a clean gauze.  Using a clean technique the botulinum toxin was diluted with 3 mL of bacteriostatic saline per 300 unit vial which resulted in 10 units per 0.1 mL.   Subsequently the mixture was injected in the glabellar, forehead area with preservation of the temporal branch to the lateral eyebrow. A total of 130 Units of botulinum toxin was used. The forehead and glabellar area was injected with care to inject intramuscular only while holding pressure on the supratrochlear vessels in each area during each injection on either side of the medial corrugators. The injection proceeded vertically superiorly to the medial 2/3 of the frontalis muscle and superior 2/3 of the lateral frontalis, again with preservation of the frontal branch.   No complications were noted. Light pressure was held for 5 minutes. She was instructed explicitly in post-operative care.   Dysport LOT:  [id number] EXP:  04/10/2024

## 2022-10-11 ENCOUNTER — Institutional Professional Consult (permissible substitution): Payer: Medicare Other | Admitting: Plastic Surgery

## 2022-12-10 ENCOUNTER — Ambulatory Visit (INDEPENDENT_AMBULATORY_CARE_PROVIDER_SITE_OTHER): Payer: Self-pay | Admitting: Surgical

## 2022-12-10 DIAGNOSIS — Z411 Encounter for cosmetic surgery: Secondary | ICD-10-CM

## 2022-12-10 NOTE — Progress Notes (Signed)
Botulinum Toxin Procedure Note  Procedure: Cosmetic botulinum toxin  Pre-operative Diagnosis: Dynamic rhytides  Post-operative Diagnosis: Same  Complications:  None  Brief history: The patient desires botulinum toxin injection.  She previously had 130 units of Dysport injected on 09/11/2022.  She reports this overall went well, however she would like a little bit additional in the upper forehead at this time.  She reports that the majority of the Dysport has worn off at this point She is aware of the risks including bleeding, damage to deeper structures, asymmetry, brow ptosis, eyelid ptosis, bruising. The patient understands and wishes to proceed.  Procedure: The area was prepped with alcohol and dried with a clean gauze.  Using a clean technique the botulinum toxin was diluted with 2.5 mL of bacteriostatic saline per 100 unit vial which resulted in 4 units per 0.1 mL.  Subsequently the mixture was injected in the glabellar, lateral canthal lines, forehead area with preservation of the temporal branch to the lateral eyebrow. A total of 120 Units of botulinum toxin was used. The forehead and glabellar area was injected with care to inject intramuscular only while holding pressure on the supratrochlear vessels in each area during each injection on either side of the medial corrugators. The injection proceeded vertically superiorly to the medial 2/3 of the frontalis muscle and superior 2/3 of the lateral frontalis, again with preservation of the frontal branch.  No complications were noted. Light pressure was held for 5 minutes. She was instructed explicitly in post-operative care.  Dysport LOT:  [id number] EXP:  04/10/24

## 2023-01-17 ENCOUNTER — Ambulatory Visit (INDEPENDENT_AMBULATORY_CARE_PROVIDER_SITE_OTHER): Payer: Medicare Other | Admitting: Nurse Practitioner

## 2023-01-17 ENCOUNTER — Encounter: Payer: Self-pay | Admitting: Nurse Practitioner

## 2023-01-17 VITALS — BP 140/74 | HR 68 | Temp 98.0°F | Resp 16 | Ht 62.75 in | Wt 113.1 lb

## 2023-01-17 DIAGNOSIS — G43701 Chronic migraine without aura, not intractable, with status migrainosus: Secondary | ICD-10-CM | POA: Diagnosis not present

## 2023-01-17 DIAGNOSIS — I10 Essential (primary) hypertension: Secondary | ICD-10-CM

## 2023-01-17 LAB — CBC
MCH: 30.7 pg (ref 27.0–33.0)
Platelets: 265 10*3/uL (ref 140–400)
RBC: 3.91 10*6/uL (ref 3.80–5.10)
WBC: 5.5 10*3/uL (ref 3.8–10.8)

## 2023-01-17 MED ORDER — AMLODIPINE BESYLATE 2.5 MG PO TABS: 2.5000 mg | ORAL_TABLET | Freq: Every day | ORAL | 1 refills | Status: DC

## 2023-01-17 MED ORDER — BUTALBITAL-APAP-CAFFEINE 50-300-40 MG PO CAPS: ORAL_CAPSULE | ORAL | 0 refills | Status: DC

## 2023-01-17 NOTE — Patient Instructions (Signed)
Nice to see you today Check your blood pressure daily for me for the next week while on the medication Bring your cuff with you to your next office visit too I want to see you in 1 month, sooner if you need me

## 2023-01-17 NOTE — Assessment & Plan Note (Signed)
History of the same will use butalbital on a infrequent basis.  Patient states she is going to be traveling to [place] and would like to have it just in case refill provided today.

## 2023-01-17 NOTE — Assessment & Plan Note (Signed)
Will start on amlodipine 2.'5mg'$ . She is getting high bp readings.  Patient has a blood pressure cuff at home she will check her blood pressure once a day for the next week while starting medication encourage patient bring blood pressure cuff to next office visit.  Will get basic labs today

## 2023-01-17 NOTE — Progress Notes (Signed)
New Patient Office Visit  Subjective    Patient ID: Kim Potts, female    DOB: 11/26/1940  Age: 82 y.o. MRN: [id number]  CC:  Chief Complaint  Patient presents with   Establish Care    HPI Kim Potts presents to establish care  Migraines: States they have been getting less frequent. More than a month since she had one. Whole head. Throbs. States that she will have an aura. Starts in the back of her. Light sound sensitivity and n/v.  Stress and red wine   Htn: States that she has been having elevated blood pressure in the urgent care center. States that she has a BP cuff at home. States that she has been checking it daily. States that the number were 170s/90  Tdap: needs update at local pharmacy  Flu vaccines: has not had the flu vaccine Covid: original and booster RSV: UTD Shringix: refused [id number] given at discharge    Mammograms: refused  Colonoscopy: refused   Advance directive: does not have did discussed HPOA and POA  Outpatient Encounter Medications as of 01/17/2023  Medication Sig   amLODipine (NORVASC) 2.5 MG tablet Take 1 tablet (2.5 mg total) by mouth daily.   [DISCONTINUED] Butalbital-APAP-Caffeine 50-300-40 MG CAPS Take 1-2 capsule by mouth every 6 hours as needed for headache. Use sparingly.   Butalbital-APAP-Caffeine 50-300-40 MG CAPS Take 1-2 capsule by mouth every 6 hours as needed for headache. Use sparingly.   No facility-administered encounter medications on file as of 01/17/2023.    Past Medical History:  Diagnosis Date   Frequent headaches    Migraines     Past Surgical History:  Procedure Laterality Date   APPENDECTOMY  1946    Family History  Problem Relation Age of Onset   Diabetes Mother    Alzheimer's disease Father     Social History   Socioeconomic History   Marital status: Widowed    Spouse name: Not on file   Number of children: 3   Years of education: Not on file   Highest education level: Not on  file  Occupational History   Not on file  Tobacco Use   Smoking status: Never   Smokeless tobacco: Never  Vaping Use   Vaping Use: Never used  Substance and Sexual Activity   Alcohol use: Not Currently   Drug use: Not Currently   Sexual activity: Not Currently  Other Topics Concern   Not on file  Social History Narrative   Widowed.   3 children, 1 step-child. 5 grandchildren, 4 step-grandchildren.   Currently working as a Pharmacist, hospital. Highschool enligsh    Enjoys reading.   Social Determinants of Health   Financial Resource Strain: Not on file  Food Insecurity: Not on file  Transportation Needs: Not on file  Physical Activity: Not on file  Stress: Not on file  Social Connections: Not on file  Intimate Partner Violence: Not on file    Review of Systems  Constitutional:  Negative for chills and fever.  Respiratory:  Negative for shortness of breath.   Cardiovascular:  Negative for chest pain.  Neurological:  Negative for headaches.  Psychiatric/Behavioral:  Negative for hallucinations and suicidal ideas.         Objective    BP (!) 140/74   Pulse 68   Temp 98 F (36.7 C)   Resp 16   Ht 5' 2.75" (1.594 m)   Wt 113 lb 2 oz (51.3 kg)   SpO2 99%   BMI 20.20  kg/m   Physical Exam Vitals and nursing note reviewed.  Constitutional:      Appearance: Normal appearance.  HENT:     Right Ear: Tympanic membrane, ear canal and external ear normal.     Left Ear: Tympanic membrane, ear canal and external ear normal.     Ears:     Comments: Wears hearing aids.    Mouth/Throat:     Mouth: Mucous membranes are moist.     Pharynx: Oropharynx is clear.  Eyes:     Extraocular Movements: Extraocular movements intact.     Pupils: Pupils are equal, round, and reactive to light.  Cardiovascular:     Rate and Rhythm: Normal rate and regular rhythm.     Heart sounds: Normal heart sounds.  Pulmonary:     Effort: Pulmonary effort is normal.     Breath sounds: Normal breath  sounds.  Musculoskeletal:     Right lower leg: No edema.     Left lower leg: Edema present.  Lymphadenopathy:     Cervical: No cervical adenopathy.  Skin:    General: Skin is warm.  Neurological:     General: No focal deficit present.     Mental Status: She is alert.         Assessment & Plan:   Problem List Items Addressed This Visit       Cardiovascular and Mediastinum   Primary hypertension - Primary    Will start on amlodipine 2.'5mg'$ . She is getting high bp readings.  Patient has a blood pressure cuff at home she will check her blood pressure once a day for the next week while starting medication encourage patient bring blood pressure cuff to next office visit.  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  Patient states she is going to be traveling to [place] and would like to have it just in case refill provided today.      Relevant Medications   amLODipine (NORVASC) 2.5 MG tablet   Butalbital-APAP-Caffeine 50-300-40 MG CAPS    Return in about 4 weeks (around 02/14/2023) for BP recheck.   Kim Garret, NP

## 2023-01-18 LAB — COMPREHENSIVE METABOLIC PANEL
AG Ratio: 1.7 (calc) (ref 1.0–2.5)
ALT: 20 U/L (ref 6–29)
AST: 19 U/L (ref 10–35)
Albumin: 4.5 g/dL (ref 3.6–5.1)
Alkaline phosphatase (APISO): 96 U/L (ref 37–153)
BUN/Creatinine Ratio: 32 (calc) — ABNORMAL HIGH (ref 6–22)
BUN: 32 mg/dL — ABNORMAL HIGH (ref 7–25)
CO2: 21 mmol/L (ref 20–32)
Calcium: 10.2 mg/dL (ref 8.6–10.4)
Chloride: 106 mmol/L (ref 98–110)
Creat: 0.99 mg/dL — ABNORMAL HIGH (ref 0.60–0.95)
Globulin: 2.7 g/dL (calc) (ref 1.9–3.7)
Glucose, Bld: 87 mg/dL (ref 65–99)
Potassium: 4.5 mmol/L (ref 3.5–5.3)
Sodium: 140 mmol/L (ref 135–146)
Total Bilirubin: 0.3 mg/dL (ref 0.2–1.2)
Total Protein: 7.2 g/dL (ref 6.1–8.1)

## 2023-01-18 LAB — CBC
HCT: 36.2 % (ref 35.0–45.0)
Hemoglobin: 12 g/dL (ref 11.7–15.5)
MCHC: 33.1 g/dL (ref 32.0–36.0)
MCV: 92.6 fL (ref 80.0–100.0)
MPV: 10 fL (ref 7.5–12.5)
RDW: 12.1 % (ref 11.0–15.0)

## 2023-01-18 LAB — TSH: TSH: 1.97 mIU/L (ref 0.40–4.50)

## 2023-02-14 ENCOUNTER — Ambulatory Visit (INDEPENDENT_AMBULATORY_CARE_PROVIDER_SITE_OTHER): Payer: Medicare Other | Admitting: Nurse Practitioner

## 2023-02-14 ENCOUNTER — Encounter: Payer: Self-pay | Admitting: Nurse Practitioner

## 2023-02-14 DIAGNOSIS — I10 Essential (primary) hypertension: Secondary | ICD-10-CM | POA: Diagnosis not present

## 2023-02-14 MED ORDER — AMLODIPINE BESYLATE 2.5 MG PO TABS: 2.5000 mg | ORAL_TABLET | Freq: Every day | ORAL | 1 refills | Status: DC

## 2023-02-14 NOTE — Progress Notes (Signed)
   Established Patient Office Visit  Subjective   Patient ID: Kim Potts, female    DOB: [birth year]  Age: 82 y.o. MRN: [id number]  Chief Complaint  Patient presents with   Hypertension      HTN: Patient was seen on 01/17/2023 with a new patient appointment.  She been having elevated blood pressures for sometimes.  She does have a blood pressure cuff at home, she has been checking it numbers were in the 170s systolic and 90s diastolic.  Patient was started on amlodipine 2.5 mg and is here for recheck.  States that she has been taking the medication as prescribed.  She is not having any adverse drug events per her report.  She denies any lightheadedness or dizziness. States that she has been checking her blood pressure at home and got 140/70.      Review of Systems  Constitutional:  Negative for chills and fever.  Respiratory:  Negative for shortness of breath.   Cardiovascular:  Negative for chest pain.  Gastrointestinal:  Negative for abdominal pain and constipation.  Neurological:  Negative for dizziness and headaches.      Objective:     BP 118/74   Pulse 68   Temp 97.7 F (36.5 C)   Resp 16   Ht 5' 2.75" (1.594 m)   Wt 113 lb 4 oz (51.4 kg)   SpO2 99%   BMI 20.22 kg/m  BP Readings from Last 3 Encounters:  02/14/23 118/74  01/17/23 (!) 140/74  02/22/21 (!) 182/86   Wt Readings from Last 3 Encounters:  02/14/23 113 lb 4 oz (51.4 kg)  01/17/23 113 lb 2 oz (51.3 kg)  08/19/17 116 lb 6.4 oz (52.8 kg)      Physical Exam Vitals and nursing note reviewed.  Constitutional:      Appearance: Normal appearance.  Cardiovascular:     Rate and Rhythm: Normal rate and regular rhythm.     Heart sounds: Normal heart sounds.  Pulmonary:     Effort: Pulmonary effort is normal.     Breath sounds: Normal breath sounds.  Musculoskeletal:     Right lower leg: No edema.     Left lower leg: Edema present.  Neurological:     Mental Status: She is alert.      No  results found for any visits on 02/14/23.    The ASCVD Risk score (Arnett DK, et al., 2019) failed to calculate for the following reasons:   The 2019 ASCVD risk score is only valid for ages 85 to 10    Assessment & Plan:   Problem List Items Addressed This Visit       Cardiovascular and Mediastinum   Primary hypertension    Patient's blood pressures responded nicely to amlodipine 2.5 mg daily.  Blood pressure was elevated at beginning of office visit but upon recheck came down.  Continue amlodipine 2.5 mg.      Relevant Medications   amLODipine (NORVASC) 2.5 MG tablet    Return in about 6 months (around 08/16/2023) for BP recheck.    Audria Nine, NP

## 2023-02-14 NOTE — Assessment & Plan Note (Signed)
Patient's blood pressures responded nicely to amlodipine 2.5 mg daily.  Blood pressure was elevated at beginning of office visit but upon recheck came down.  Continue amlodipine 2.5 mg.

## 2023-02-14 NOTE — Patient Instructions (Signed)
Nice to see you today We are going to keep the same dose of medication. I have refilled them Follow up with me in 6 months for a recheck, sooner if you need me

## 2023-02-17 ENCOUNTER — Other Ambulatory Visit: Payer: Self-pay | Admitting: Surgical

## 2023-03-13 NOTE — Progress Notes (Signed)
Patient is a very pleasant 82 year old female here to discuss additional neuromodulator injection as well as filler administration.  Her last Dysport injection was on 12/10/2022, she reports that she did well with this and she would like additional injection of neuromodulator.  She reports that she is also interested in discussing fine lines around her mouth, particularly the upper perioral area/lip.  She has questions about whether laser or filler would work best for this.  Discussed with patient I recommend laser to her entire face.  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  We discussed other options including nano peel or micro peel which are less intense, provide improvement in tone, texture and pigmentation.  Patient was agreeable to this.  She would like to proceed with either the micro peel or nano peel, she is going to do some research and call to schedule that early June with me.  Botulinum Toxin Procedure Note  Procedure: Cosmetic botulinum toxin  Pre-operative Diagnosis: Dynamic rhytides  Post-operative Diagnosis: Same  Complications:  None  Brief history: The patient desires botulinum toxin injection.  She is aware of the risks including bleeding, damage to deeper structures, asymmetry, brow ptosis, eyelid ptosis, bruising. The patient understands and wishes to proceed.  Procedure: The area was prepped with alcohol and dried with a clean gauze.  Using a clean technique the botulinum toxin was diluted with 3 mL of bacteriostatic saline per 300 unit vial which resulted in 10 units per 0.1 mL.  Subsequently the mixture was injected in the glabellar, lateral canthal lines, forehead area with preservation of the temporal branch to the lateral eyebrow. A total of 120 Units of botulinum toxin was used. The forehead and glabellar area was injected with care to inject intramuscular only while holding pressure on the supratrochlear vessels in each area during each injection on either side of the medial  corrugators. The injection proceeded vertically superiorly to the medial 2/3 of the frontalis muscle and superior 2/3 of the lateral frontalis, again with preservation of the frontal branch.  No complications were noted. Light pressure was held for 5 minutes. She was instructed explicitly in post-operative care.  Dysport LOT: [id number] EXP: 2025

## 2023-03-14 ENCOUNTER — Ambulatory Visit (INDEPENDENT_AMBULATORY_CARE_PROVIDER_SITE_OTHER): Payer: Self-pay | Admitting: Surgical

## 2023-03-14 ENCOUNTER — Encounter: Payer: Self-pay | Admitting: Surgical

## 2023-03-14 VITALS — BP 171/79 | HR 65 | Ht 64.5 in | Wt 115.6 lb

## 2023-03-14 DIAGNOSIS — Z411 Encounter for cosmetic surgery: Secondary | ICD-10-CM

## 2023-05-27 ENCOUNTER — Encounter: Payer: Self-pay | Admitting: Nurse Practitioner

## 2023-05-27 ENCOUNTER — Ambulatory Visit (INDEPENDENT_AMBULATORY_CARE_PROVIDER_SITE_OTHER): Payer: Medicare Other | Admitting: Nurse Practitioner

## 2023-05-27 VITALS — BP 136/78 | HR 83 | Temp 98.8°F | Ht 64.5 in | Wt 114.0 lb

## 2023-05-27 DIAGNOSIS — I10 Essential (primary) hypertension: Secondary | ICD-10-CM

## 2023-05-27 DIAGNOSIS — R221 Localized swelling, mass and lump, neck: Secondary | ICD-10-CM | POA: Diagnosis not present

## 2023-05-27 DIAGNOSIS — M79644 Pain in right finger(s): Secondary | ICD-10-CM | POA: Diagnosis not present

## 2023-05-27 NOTE — Assessment & Plan Note (Signed)
Do think this is an overuse injury/tendinitis.  Patient will continue using anti-inflammatories ibuprofen for the next week with food.  Rest the hand is much as possible.  Follow-up if no improvement

## 2023-05-27 NOTE — Patient Instructions (Addendum)
Nice to see you today We will revisit the neck in the next two weeks Take the ibuprofen for the next 7 days daily and try to rest the hand. Follow up if no improvement  Keep your appt as scheduled

## 2023-05-27 NOTE — Progress Notes (Signed)
Acute Office Visit  Subjective:     Patient ID: Kim Potts, female    DOB: 1941-09-03, 82 y.o.   MRN: [id number]  Chief Complaint  Patient presents with   lump in throat    Pt complains of lump in right side of throat. Hurts to touch. Difficult to swallow.    Hand Pain    complains of left hand pain when grabbing. Bone near thumb is aching. Pt wants to know if it is arthritis.       Patient is in today for multiple complaints with a history of HTN and chronic migraine   Mass: right side that she noticed it approx 1 omnth ago. States that she did not pay attention to it . Statse that she got concerned. States that when she swallows her pills she feels like it gets in the way. Statse that she does ok with food. States that it does not seem to have grown any. States that it is tender to the toufch    Hand pain: left sided at the base of the thumb. States that it is painful. States that it does not hurt at rest. States that when she lays down at night but can get it to go away;. States no weakness in the arm. States that she is taking ibuprofen intermittently but does help with the pain.  Denies any injury or fall.  Patient states she is not having any trouble with dropping items with that hand.  Patient is right-hand dominant  Review of Systems  Constitutional:  Negative for chills and fever.  Respiratory:  Negative for shortness of breath.   Cardiovascular:  Negative for chest pain.  Musculoskeletal:  Positive for joint pain.  Neurological:  Negative for headaches.  Psychiatric/Behavioral:  Negative for hallucinations and suicidal ideas.         Objective:    BP 136/78   Pulse 83   Temp 98.8 F (37.1 C) (Temporal)   Ht 5' 4.5" (1.638 m)   Wt 114 lb (51.7 kg)   SpO2 96%   BMI 19.27 kg/m  BP Readings from Last 3 Encounters:  05/27/23 136/78  03/14/23 (!) 171/79  02/14/23 118/74   Wt Readings from Last 3 Encounters:  05/27/23 114 lb (51.7 kg)  03/14/23 115  lb 9.6 oz (52.4 kg)  02/14/23 113 lb 4 oz (51.4 kg)      Physical Exam Vitals and nursing note reviewed.  Constitutional:      Appearance: Normal appearance.  HENT:     Right Ear: Tympanic membrane, ear canal and external ear normal.     Left Ear: Tympanic membrane, ear canal and external ear normal.     Mouth/Throat:     Mouth: Mucous membranes are moist.     Pharynx: Oropharynx is clear.  Cardiovascular:     Rate and Rhythm: Normal rate and regular rhythm.     Heart sounds: Normal heart sounds.  Pulmonary:     Effort: Pulmonary effort is normal.     Breath sounds: Normal breath sounds.  Musculoskeletal:       Arms:  Lymphadenopathy:     Cervical: No cervical adenopathy.  Neurological:     Mental Status: She is alert.     No results found for any visits on 05/27/23.      Assessment & Plan:   Problem List Items Addressed This Visit       Cardiovascular and Mediastinum   Primary hypertension     Other  Pain of right thumb    Do think this is an overuse injury/tendinitis.  Patient will continue using anti-inflammatories ibuprofen for the next week with food.  Rest the hand is much as possible.  Follow-up if no improvement      Mass in neck - Primary    No palpable mass noted.  He had a discussion waiting approximately 2 weeks to see if this resolves if not we will obtain ultrasound of the neck and thyroid.       No orders of the defined types were placed in this encounter.   Return if symptoms worsen or fail to improve, for As scheduled .  Audria Nine, NP

## 2023-05-27 NOTE — Assessment & Plan Note (Addendum)
No palpable mass noted.  He had a discussion waiting approximately 2 weeks to see if this resolves if not we will obtain ultrasound of the neck and thyroid.

## 2023-06-10 ENCOUNTER — Telehealth: Payer: Self-pay | Admitting: Nurse Practitioner

## 2023-06-10 NOTE — Telephone Encounter (Signed)
Can we call and see how the patient is doing in regards to the place on her neck and see if she wants an [place]

## 2023-06-26 ENCOUNTER — Ambulatory Visit (INDEPENDENT_AMBULATORY_CARE_PROVIDER_SITE_OTHER): Payer: Medicare Other

## 2023-06-26 DIAGNOSIS — Z0000 Encounter for general adult medical examination without abnormal findings: Secondary | ICD-10-CM | POA: Diagnosis not present

## 2023-06-26 NOTE — Progress Notes (Signed)
Subjective:   Kim Potts is a 82 y.o. female who presents for an Initial Medicare Annual Wellness Visit.  Visit Complete: Virtual  I connected with  [name] on 06/26/23 by a audio enabled telemedicine application and verified that I am speaking with the correct person using two identifiers.  Patient Location: Home  Provider Location: Office/Clinic  I discussed the limitations of evaluation and management by telemedicine. The patient expressed understanding and agreed to proceed.  Vital Signs: Unable to obtain new vitals due to this being a telehealth visit.  Review of Systems     Cardiac Risk Factors include: advanced age (>53men, >41 women);hypertension     Objective:    Today's Vitals   There is no height or weight on file to calculate BMI.     06/26/2023    4:34 PM  Advanced Directives  Does Patient Have a Medical Advance Directive? No    Current Medications (verified) Outpatient Encounter Medications as of 06/26/2023  Medication Sig   amLODipine (NORVASC) 2.5 MG tablet Take 1 tablet (2.5 mg total) by mouth daily.   Butalbital-APAP-Caffeine 50-300-40 MG CAPS Take 1-2 capsule by mouth every 6 hours as needed for headache. Use sparingly.   sulfamethoxazole-trimethoprim (BACTRIM DS) 800-160 MG tablet Take 1 tablet by mouth 2 (two) times daily. For 7 days   No facility-administered encounter medications on file as of 06/26/2023.    Allergies (verified) Patient has no known allergies.   History: Past Medical History:  Diagnosis Date   Frequent headaches    Migraines    Past Surgical History:  Procedure Laterality Date   APPENDECTOMY  1946   CATARACT EXTRACTION Bilateral    01/2023   Family History  Problem Relation Age of Onset   Diabetes Mother    Alzheimer's disease Father    Social History   Socioeconomic History   Marital status: Widowed    Spouse name: Not on file   Number of children: 3   Years of education: Not on file    Highest education level: Not on file  Occupational History   Not on file  Tobacco Use   Smoking status: Never   Smokeless tobacco: Never  Vaping Use   Vaping status: Never Used  Substance and Sexual Activity   Alcohol use: Not Currently   Drug use: Not Currently   Sexual activity: Not Currently  Other Topics Concern   Not on file  Social History Narrative   Widowed.   3 children, 1 step-child. 5 grandchildren, 4 step-grandchildren.   Currently working as a Runner, broadcasting/film/video. Highschool enligsh    Enjoys reading.   Social Determinants of Health   Financial Resource Strain: Low Risk  (06/26/2023)   Overall Financial Resource Strain (CARDIA)    Difficulty of Paying Living Expenses: Not hard at all  Food Insecurity: No Food Insecurity (06/26/2023)   Hunger Vital Sign    Worried About Running Out of Food in the Last Year: Never true    Ran Out of Food in the Last Year: Never true  Transportation Needs: No Transportation Needs (06/26/2023)   PRAPARE - Administrator, Civil Service (Medical): No    Lack of Transportation (Non-Medical): No  Physical Activity: Sufficiently Active (06/26/2023)   Exercise Vital Sign    Days of Exercise per Week: 7 days    Minutes of Exercise per Session: 30 min  Stress: No Stress Concern Present (06/26/2023)   [employer] of Occupational Health - Occupational Stress Questionnaire  Feeling of Stress : Not at all  Social Connections: Socially Isolated (06/26/2023)   Social Connection and Isolation Panel [NHANES]    Frequency of Communication with Friends and Family: More than three times a week    Frequency of Social Gatherings with Friends and Family: Never    Attends Religious Services: Never    Database administrator or Organizations: No    Attends Banker Meetings: Never    Marital Status: Widowed    Tobacco Counseling Counseling given: Not Answered   Clinical Intake:  Pre-visit preparation completed: Yes  Pain :  No/denies pain     Nutritional Risks: None Diabetes: No  How often do you need to have someone help you when you read instructions, pamphlets, or other written materials from your doctor or pharmacy?: 1 - Never  Interpreter Needed?: No  Information entered by :: NAllen LPN   Activities of Daily Living    06/26/2023    4:22 PM  In your present state of health, do you have any difficulty performing the following activities:  Hearing? 1  Comment has hearing aids  Vision? 0  Difficulty concentrating or making decisions? 0  Walking or climbing stairs? 0  Dressing or bathing? 0  Doing errands, shopping? 0  Preparing Food and eating ? N  Using the Toilet? N  In the past six months, have you accidently leaked urine? Y  Comment has an UTI  Do you have problems with loss of bowel control? N  Managing your Medications? N  Managing your Finances? N  Housekeeping or managing your Housekeeping? N    Patient Care Team: Eden Emms, NP as PCP - General (Nurse Practitioner) Pa, [facility]  Indicate any recent Medical Services you may have received from other than [facility] providers in the past year (date may be approximate).     Assessment:   This is a routine wellness examination for [facility].  Hearing/Vision screen Hearing Screening - Comments:: Has hearing aids Vision Screening - Comments:: Regular eye exams, [facility]  Dietary issues and exercise activities discussed:     Goals Addressed             This Visit's Progress    Patient Stated       06/26/2023, stay healthy       Depression Screen    06/26/2023    4:36 PM 05/27/2023   11:16 AM 01/17/2023    4:37 PM  PHQ 2/9 Scores  PHQ - 2 Score 0 3 0  PHQ- 9 Score 0 4 0    Fall Risk    06/26/2023    4:35 PM 01/17/2023    4:37 PM 06/10/2019   10:31 AM  Fall Risk   Falls in the past year? 0 1 --  Comment   Emmi Telephone Survey: data to providers prior to load  Number falls in past yr: 0 0 --   Comment   Emmi Telephone Survey Actual Response =   Injury with Fall? 0 0   Risk for fall due to : Medication side effect No Fall Risks   Follow up Falls prevention discussed;Falls evaluation completed Falls evaluation completed     MEDICARE RISK AT HOME:  Medicare Risk at Home - 06/26/23 1635     Any stairs in or around the home? No    If so, are there any without handrails? No    Home free of loose throw rugs in walkways, pet beds, electrical cords,  etc? Yes    Adequate lighting in your home to reduce risk of falls? Yes    Life alert? No    Use of a cane, walker or w/c? No    Grab bars in the bathroom? No    Shower chair or bench in shower? No    Elevated toilet seat or a handicapped toilet? No             TIMED UP AND GO:  Was the test performed? No    Cognitive Function:        06/26/2023    4:37 PM  6CIT Screen  What Year? 0 points  What month? 0 points  What time? 0 points  Count back from 20 0 points  Months in reverse 0 points  Repeat phrase 4 points  Total Score 4 points    Immunizations Immunization History  Administered Date(s) Administered   Influenza,inj,Quad PF,6+ Mos 08/19/2017   PFIZER(Purple Top)SARS-COV-2 Vaccination 01/07/2020, 01/31/2020, 08/13/2020, 05/30/2022   PNEUMOCOCCAL CONJUGATE-20 05/15/2023    TDAP status: Due, Education has been provided regarding the importance of this vaccine. Advised may receive this vaccine at local pharmacy or Health Dept. Aware to provide a copy of the vaccination record if obtained from local pharmacy or Health Dept. Verbalized acceptance and understanding.  Flu Vaccine status: Due, Education has been provided regarding the importance of this vaccine. Advised may receive this vaccine at local pharmacy or Health Dept. Aware to provide a copy of the vaccination record if obtained from local pharmacy or Health Dept. Verbalized acceptance and understanding.  Pneumococcal vaccine status: Up to date  Covid-19  vaccine status: Information provided on how to obtain vaccines.   Qualifies for Shingles Vaccine? Yes   Zostavax completed No   Shingrix Completed?: No.    Education has been provided regarding the importance of this vaccine. Patient has been advised to call insurance company to determine out of pocket expense if they have not yet received this vaccine. Advised may also receive vaccine at local pharmacy or Health Dept. Verbalized acceptance and understanding.  Screening Tests Health Maintenance  Topic Date Due   DTaP/Tdap/Td (1 - Tdap) Never done   Zoster Vaccines- Shingrix (1 of 2) Never done   DEXA SCAN  Never done   COVID-19 Vaccine (5 - 2023-24 season) 07/25/2022   INFLUENZA VACCINE  06/12/2023   Medicare Annual Wellness (AWV)  06/25/2024   Pneumonia Vaccine 7+ Years old  Completed   HPV VACCINES  Aged Out    Health Maintenance  Health Maintenance Due  Topic Date Due   DTaP/Tdap/Td (1 - Tdap) Never done   Zoster Vaccines- Shingrix (1 of 2) Never done   DEXA SCAN  Never done   COVID-19 Vaccine (5 - 2023-24 season) 07/25/2022   INFLUENZA VACCINE  06/12/2023    Colorectal cancer screening: No longer required.   Mammogram status: No longer required due to age.  Bone Density status: declines  Lung Cancer Screening: (Low Dose CT Chest recommended if Age 53-80 years, 20 pack-year currently smoking OR have quit w/in 15years.) does not qualify.   Lung Cancer Screening Referral: no  Additional Screening:  Hepatitis C Screening: does not qualify;   Vision Screening: Recommended annual ophthalmology exams for early detection of glaucoma and other disorders of the eye. Is the patient up to date with their annual eye exam?  Yes  Who is the provider or what is the name of the office in which the patient attends annual eye exams?  [facility] If pt is not established with a provider, would they like to be referred to a provider to establish care? No .   Dental Screening:  Recommended annual dental exams for proper oral hygiene  Diabetic Foot Exam: n/a  Community Resource Referral / Chronic Care Management: CRR required this visit?  No   CCM required this visit?  No     Plan:     I have personally reviewed and noted the following in the patient's chart:   Medical and social history Use of alcohol, tobacco or illicit drugs  Current medications and supplements including opioid prescriptions. Patient is not currently taking opioid prescriptions. Functional ability and status Nutritional status Physical activity Advanced directives List of other physicians Hospitalizations, surgeries, and ER visits in previous 12 months Vitals Screenings to include cognitive, depression, and falls Referrals and appointments  In addition, I have reviewed and discussed with patient certain preventive protocols, quality metrics, and best practice recommendations. A written personalized care plan for preventive services as well as general preventive health recommendations were provided to patient.     Barb Merino, LPN   1/61/0960   After Visit Summary: (MyChart) Due to this being a telephonic visit, the after visit summary with patients personalized plan was offered to patient via MyChart   Nurse Notes: none

## 2023-06-26 NOTE — Patient Instructions (Signed)
Kim Potts , Thank you for taking time to come for your Medicare Wellness Visit. I appreciate your ongoing commitment to your health goals. Please review the following plan we discussed and let me know if I can assist you in the future.   Referrals/Orders/Follow-Ups/Clinician Recommendations: none  This is a list of the screening recommended for you and due dates:  Health Maintenance  Topic Date Due   DTaP/Tdap/Td vaccine (1 - Tdap) Never done   Zoster (Shingles) Vaccine (1 of 2) Never done   DEXA scan (bone density measurement)  Never done   COVID-19 Vaccine (5 - 2023-24 season) 07/25/2022   Flu Shot  06/12/2023   Medicare Annual Wellness Visit  06/25/2024   Pneumonia Vaccine  Completed   HPV Vaccine  Aged Out    Advanced directives: (ACP Link)Information on Advanced Care Planning can be found at [facility] Advance Health Care Directives Advance Health Care Directives ([email or web address]   Next Medicare Annual Wellness Visit scheduled for next year: Yes  Preventive Care 65 Years and Older, Female Preventive care refers to lifestyle choices and visits with your health care provider that can promote health and wellness. What does preventive care include? A yearly physical exam. This is also called an annual well check. Dental exams once or twice a year. Routine eye exams. Ask your health care provider how often you should have your eyes checked. Personal lifestyle choices, including: Daily care of your teeth and gums. Regular physical activity. Eating a healthy diet. Avoiding tobacco and drug use. Limiting alcohol use. Practicing safe sex. Taking low-dose aspirin every day. Taking vitamin and mineral supplements as recommended by your health care provider. What happens during an annual well check? The services and screenings done by your health care provider during your annual well check will depend on your age, overall health, lifestyle risk factors, and family  history of disease. Counseling  Your health care provider may ask you questions about your: Alcohol use. Tobacco use. Drug use. Emotional well-being. Home and relationship well-being. Sexual activity. Eating habits. History of falls. Memory and ability to understand (cognition). Work and work Astronomer. Reproductive health. Screening  You may have the following tests or measurements: Height, weight, and BMI. Blood pressure. Lipid and cholesterol levels. These may be checked every 5 years, or more frequently if you are over 69 years old. Skin check. Lung cancer screening. You may have this screening every year starting at age 58 if you have a 30-pack-year history of smoking and currently smoke or have quit within the past 15 years. Fecal occult blood test (FOBT) of the stool. You may have this test every year starting at age 19. Flexible sigmoidoscopy or colonoscopy. You may have a sigmoidoscopy every 5 years or a colonoscopy every 10 years starting at age 39. Hepatitis C blood test. Hepatitis B blood test. Sexually transmitted disease (STD) testing. Diabetes screening. This is done by checking your blood sugar (glucose) after you have not eaten for a while (fasting). You may have this done every 1-3 years. Bone density scan. This is done to screen for osteoporosis. You may have this done starting at age 58. Mammogram. This may be done every 1-2 years. Talk to your health care provider about how often you should have regular mammograms. Talk with your health care provider about your test results, treatment options, and if necessary, the need for more tests. Vaccines  Your health care provider may recommend certain vaccines, such as: Influenza vaccine. This is  recommended every year. Tetanus, diphtheria, and acellular pertussis (Tdap, Td) vaccine. You may need a Td booster every 10 years. Zoster vaccine. You may need this after age 42. Pneumococcal 13-valent conjugate (PCV13)  vaccine. One dose is recommended after age 3. Pneumococcal polysaccharide (PPSV23) vaccine. One dose is recommended after age 54. Talk to your health care provider about which screenings and vaccines you need and how often you need them. This information is not intended to replace advice given to you by your health care provider. Make sure you discuss any questions you have with your health care provider. Document Released: 11/24/2015 Document Revised: 07/17/2016 Document Reviewed: 08/29/2015 Elsevier Interactive Patient Education  2017 [employer].  Fall Prevention in the Home Falls can cause injuries. They can happen to people of all ages. There are many things you can do to make your home safe and to help prevent falls. What can I do on the outside of my home? Regularly fix the edges of walkways and driveways and fix any cracks. Remove anything that might make you trip as you walk through a door, such as a raised step or threshold. Trim any bushes or trees on the path to your home. Use bright outdoor lighting. Clear any walking paths of anything that might make someone trip, such as rocks or tools. Regularly check to see if handrails are loose or broken. Make sure that both sides of any steps have handrails. Any raised decks and porches should have guardrails on the edges. Have any leaves, snow, or ice cleared regularly. Use sand or salt on walking paths during winter. Clean up any spills in your garage right away. This includes oil or grease spills. What can I do in the bathroom? Use night lights. Install grab bars by the toilet and in the tub and shower. Do not use towel bars as grab bars. Use non-skid mats or decals in the tub or shower. If you need to sit down in the shower, use a plastic, non-slip stool. Keep the floor dry. Clean up any water that spills on the floor as soon as it happens. Remove soap buildup in the tub or shower regularly. Attach bath mats securely with  double-sided non-slip rug tape. Do not have throw rugs and other things on the floor that can make you trip. What can I do in the bedroom? Use night lights. Make sure that you have a light by your bed that is easy to reach. Do not use any sheets or blankets that are too big for your bed. They should not hang down onto the floor. Have a firm chair that has side arms. You can use this for support while you get dressed. Do not have throw rugs and other things on the floor that can make you trip. What can I do in the kitchen? Clean up any spills right away. Avoid walking on wet floors. Keep items that you use a lot in easy-to-reach places. If you need to reach something above you, use a strong step stool that has a grab bar. Keep electrical cords out of the way. Do not use floor polish or wax that makes floors slippery. If you must use wax, use non-skid floor wax. Do not have throw rugs and other things on the floor that can make you trip. What can I do with my stairs? Do not leave any items on the stairs. Make sure that there are handrails on both sides of the stairs and use them. Fix handrails that are  broken or loose. Make sure that handrails are as long as the stairways. Check any carpeting to make sure that it is firmly attached to the stairs. Fix any carpet that is loose or worn. Avoid having throw rugs at the top or bottom of the stairs. If you do have throw rugs, attach them to the floor with carpet tape. Make sure that you have a light switch at the top of the stairs and the bottom of the stairs. If you do not have them, ask someone to add them for you. What else can I do to help prevent falls? Wear shoes that: Do not have high heels. Have rubber bottoms. Are comfortable and fit you well. Are closed at the toe. Do not wear sandals. If you use a stepladder: Make sure that it is fully opened. Do not climb a closed stepladder. Make sure that both sides of the stepladder are locked  into place. Ask someone to hold it for you, if possible. Clearly mark and make sure that you can see: Any grab bars or handrails. First and last steps. Where the edge of each step is. Use tools that help you move around (mobility aids) if they are needed. These include: Canes. Walkers. Scooters. Crutches. Turn on the lights when you go into a dark area. Replace any light bulbs as soon as they burn out. Set up your furniture so you have a clear path. Avoid moving your furniture around. If any of your floors are uneven, fix them. If there are any pets around you, be aware of where they are. Review your medicines with your doctor. Some medicines can make you feel dizzy. This can increase your chance of falling. Ask your doctor what other things that you can do to help prevent falls. This information is not intended to replace advice given to you by your health care provider. Make sure you discuss any questions you have with your health care provider. Document Released: 08/24/2009 Document Revised: 04/04/2016 Document Reviewed: 12/02/2014 Elsevier Interactive Patient Education  2017 [employer].

## 2023-07-02 ENCOUNTER — Ambulatory Visit (INDEPENDENT_AMBULATORY_CARE_PROVIDER_SITE_OTHER): Payer: Self-pay | Admitting: Surgical

## 2023-07-02 DIAGNOSIS — Z411 Encounter for cosmetic surgery: Secondary | ICD-10-CM

## 2023-07-02 NOTE — Progress Notes (Signed)
Botulinum Toxin Procedure Note  Procedure: Cosmetic botulinum toxin  Pre-operative Diagnosis: Dynamic rhytides  Post-operative Diagnosis: Same  Complications:  None  Brief history: The patient desires botulinum toxin injection.  She is aware of the risks including bleeding, damage to deeper structures, asymmetry, brow ptosis, eyelid ptosis, bruising. The patient understands and wishes to proceed.  Procedure: The area was prepped with alcohol and dried with a clean gauze.  Using a clean technique the botulinum toxin was diluted with 2.5 mL of bacteriostatic saline per 100 unit vial which resulted in 4 units per 0.1 mL.  Subsequently the mixture was injected in the glabellar, lateral canthal lines, forehead area with preservation of the temporal branch to the lateral eyebrow. A total of 125 Units of botulinum toxin was used. The forehead and glabellar area was injected with care to inject intramuscular only while holding pressure on the supratrochlear vessels in each area during each injection on either side of the medial corrugators. The injection proceeded vertically superiorly to the medial 2/3 of the frontalis muscle and superior 2/3 of the lateral frontalis, again with preservation of the frontal branch.  No complications were noted. Light pressure was held for 5 minutes. She was instructed explicitly in post-operative care.  Dysport LOT: [id number] EXP:  10/10/2024

## 2023-07-07 ENCOUNTER — Other Ambulatory Visit: Payer: Self-pay | Admitting: Nurse Practitioner

## 2023-07-07 DIAGNOSIS — G43701 Chronic migraine without aura, not intractable, with status migrainosus: Secondary | ICD-10-CM

## 2023-07-08 NOTE — Telephone Encounter (Signed)
LAST APPOINTMENT DATE: 05/27/2023   NEXT APPOINTMENT DATE: 08/18/2023 6 mo f/u     LAST REFILL: 01/17/23  QTY: #30 no rf

## 2023-08-06 ENCOUNTER — Ambulatory Visit (INDEPENDENT_AMBULATORY_CARE_PROVIDER_SITE_OTHER): Payer: Self-pay | Admitting: Surgical

## 2023-08-06 DIAGNOSIS — Z411 Encounter for cosmetic surgery: Secondary | ICD-10-CM

## 2023-08-06 NOTE — Progress Notes (Signed)
Preoperative Dx: Facial aging  Postoperative Dx:  same  Procedure: laser to face and neck  Anesthesia: ProNox  Description of Procedure:  Risks and complications were explained to the patient. Consent was confirmed and signed. Time out was called and all information was confirmed to be correct. The area  area was prepped with alcohol and wiped dry.   The Nano peel laser was set at 10 nm and subsequently 8 nm and the face was lasered.  The nano peel laser was then set at 6 nm depending on patient comfort and the neck was lasered.  The patient tolerated the procedure well and there were no complications.

## 2023-08-08 ENCOUNTER — Other Ambulatory Visit: Payer: Self-pay | Admitting: Nurse Practitioner

## 2023-08-08 DIAGNOSIS — I10 Essential (primary) hypertension: Secondary | ICD-10-CM

## 2023-08-18 ENCOUNTER — Encounter: Payer: Self-pay | Admitting: Nurse Practitioner

## 2023-08-18 ENCOUNTER — Ambulatory Visit (INDEPENDENT_AMBULATORY_CARE_PROVIDER_SITE_OTHER): Payer: Medicare Other | Admitting: Nurse Practitioner

## 2023-08-18 ENCOUNTER — Telehealth: Payer: Self-pay

## 2023-08-18 VITALS — BP 142/88 | HR 72 | Temp 97.5°F | Ht 64.5 in | Wt 152.0 lb

## 2023-08-18 DIAGNOSIS — I10 Essential (primary) hypertension: Secondary | ICD-10-CM | POA: Diagnosis not present

## 2023-08-18 DIAGNOSIS — Z87440 Personal history of urinary (tract) infections: Secondary | ICD-10-CM

## 2023-08-18 DIAGNOSIS — E876 Hypokalemia: Secondary | ICD-10-CM

## 2023-08-18 LAB — COMPREHENSIVE METABOLIC PANEL
ALT: 21 U/L (ref 0–35)
AST: 24 U/L (ref 0–37)
Albumin: 4.4 g/dL (ref 3.5–5.2)
Alkaline Phosphatase: 94 U/L (ref 39–117)
BUN: 16 mg/dL (ref 6–23)
CO2: 30 meq/L (ref 19–32)
Calcium: 10.2 mg/dL (ref 8.4–10.5)
Chloride: 100 meq/L (ref 96–112)
Creatinine, Ser: 0.83 mg/dL (ref 0.40–1.20)
GFR: 65.88 mL/min (ref >60.00)
Glucose, Bld: 90 mg/dL (ref 70–99)
Potassium: 2.7 meq/L — CL (ref 3.5–5.1)
Sodium: 142 meq/L (ref 135–145)
Total Bilirubin: 0.5 mg/dL (ref 0.2–1.2)
Total Protein: 7.5 g/dL (ref 6.0–8.3)

## 2023-08-18 LAB — CBC
HCT: 37.2 % (ref 36.0–46.0)
Hemoglobin: 12.4 g/dL (ref 12.0–15.0)
MCHC: 33.4 g/dL (ref 30.0–36.0)
MCV: 91.9 fL (ref 78.0–100.0)
Platelets: 286 10*3/uL (ref 150.0–400.0)
RBC: 4.05 Mil/uL (ref 3.87–5.11)
RDW: 13.8 % (ref 11.5–15.5)
WBC: 5.6 10*3/uL (ref 4.0–10.5)

## 2023-08-18 MED ORDER — POTASSIUM CHLORIDE CRYS ER 20 MEQ PO TBCR
20.0000 meq | EXTENDED_RELEASE_TABLET | Freq: Two times a day (BID) | ORAL | 0 refills | Status: DC
Start: 2023-08-18 — End: 2024-01-16

## 2023-08-18 NOTE — Assessment & Plan Note (Signed)
Patient currently maintained on amlodipine 2.5 mg daily.  Patient is dissatisfied with her readings and would like the diastolic number lower.  Did have discussion with patient given her age and lack of comorbidities that having a blood pressure of 150 or lower systolic and 90 or lower diastolic is okay with me.  Will continue the medication as is.  She is not having any lightheadedness or dizziness.

## 2023-08-18 NOTE — Assessment & Plan Note (Signed)
Patient had 2 UTIs fairly close together.  Did with urgent care and had been treated.  If this recurs consider ambulatory referral to urology or possible topical vaginal cream for prophylaxis.

## 2023-08-18 NOTE — Telephone Encounter (Signed)
Hope with [facility] lab called critical K= low at 2.7. sending note to Audria Nine NP, cable pool and will teams [facility] CMA.also logged in lab notebook.

## 2023-08-18 NOTE — Telephone Encounter (Signed)
Called patient she is going out of town Wednesday until 10/19. She will not be able to get labs later this week. She will pick up script now and get started.

## 2023-08-18 NOTE — Patient Instructions (Signed)
Nice to see you toyda I will be in touch with the labs once I have them Follow up with me in 6 months, sooner if you need me

## 2023-08-18 NOTE — Telephone Encounter (Signed)
Can we get her schedule when she returns please

## 2023-08-18 NOTE — Telephone Encounter (Signed)
Can we call and let the patient know that her potassium came back low. I am going to call in potassium pills for her. I want her to take 2 tablets at the same time when she gets them. then starting tomorrow 1 tablet in the morning and then 1 tablet in the evening until she finishes the script. I want to recheck the potassium at the end of the week with a lab visit only.  if she feels any heart palpations or chest pain to be seen in the emergency department

## 2023-08-18 NOTE — Progress Notes (Signed)
Established Patient Office Visit  Subjective   Patient ID: Kim Potts, female    DOB: 10-Dec-1940  Age: 82 y.o. MRN: [id number]  Chief Complaint  Patient presents with   Follow-up    Pt complains of two serious bladder infections.     HPI  HTn: states that she is taking her blood pressure medication. States that she is checking intermittently at home and getting the 80-90s on the bottom. States that she is stressed about the polical climate.  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 States that the first one was several weeks ago. State that the most recent one was this past Saturday. States that she was having urgency, incontinence and frequency.       Review of Systems  Constitutional:  Negative for chills and fever.  Respiratory:  Negative for shortness of breath.   Cardiovascular:  Negative for chest pain.  Genitourinary:  Negative for dysuria, frequency and urgency.  Neurological:  Negative for headaches.      Objective:     BP (!) 142/88   Pulse 72   Temp (!) 97.5 F (36.4 C) (Oral)   Ht 5' 4.5" (1.638 m)   Wt 152 lb (68.9 kg)   SpO2 95%   BMI 25.69 kg/m  BP Readings from Last 3 Encounters:  08/18/23 (!) 142/88  05/27/23 136/78  03/14/23 (!) 171/79   Wt Readings from Last 3 Encounters:  08/18/23 152 lb (68.9 kg)  05/27/23 114 lb (51.7 kg)  03/14/23 115 lb 9.6 oz (52.4 kg)   SpO2 Readings from Last 3 Encounters:  08/18/23 95%  05/27/23 96%  03/14/23 98%      Physical Exam Vitals and nursing note reviewed.  Constitutional:      Appearance: Normal appearance.  Cardiovascular:     Rate and Rhythm: Normal rate and regular rhythm.     Heart sounds: Normal heart sounds.  Pulmonary:     Effort: Pulmonary effort is normal.     Breath sounds: Normal breath sounds.  Neurological:     Mental Status: She is alert.      No results found  for any visits on 08/18/23.    The ASCVD Risk score (Arnett DK, et al., 2019) failed to calculate for the following reasons:   The 2019 ASCVD risk score is only valid for ages 38 to 29    Assessment & Plan:   Problem List Items Addressed This Visit       Cardiovascular and Mediastinum   Primary hypertension - Primary    Patient currently maintained on amlodipine 2.5 mg daily.  Patient is dissatisfied with her readings and would like the diastolic number lower.  Did have discussion with patient given her age and lack of comorbidities that having a blood pressure of 150 or lower systolic and 90 or lower diastolic is okay with me.  Will continue the medication as is.  She is not having any lightheadedness or dizziness.      Relevant Orders   CBC   Comprehensive metabolic panel     Genitourinary   History of UTI    Patient had 2 UTIs fairly close together.  Did with urgent care and had been treated.  If this recurs consider ambulatory referral to urology or possible topical vaginal cream for prophylaxis.      Relevant Medications   nitrofurantoin, macrocrystal-monohydrate, (MACROBID) 100 MG  capsule    Return in about 6 months (around 02/16/2024) for "CPE".    Audria Nine, NP

## 2023-08-25 NOTE — Telephone Encounter (Signed)
LVM for patient to c/b and schedule.  

## 2023-09-03 NOTE — Telephone Encounter (Signed)
Patient called in and stated that she needed to schedule an appointment. Spoke with [facility] and he informed me to just schedule a lab only appointment to have her potassium recheck before she takes any medication. Patient scheduled lab only appt tomorrow morning.

## 2023-09-04 ENCOUNTER — Other Ambulatory Visit (INDEPENDENT_AMBULATORY_CARE_PROVIDER_SITE_OTHER): Payer: Medicare Other | Admitting: Nurse Practitioner

## 2023-09-04 ENCOUNTER — Other Ambulatory Visit: Payer: Medicare Other

## 2023-09-04 DIAGNOSIS — E876 Hypokalemia: Secondary | ICD-10-CM | POA: Diagnosis not present

## 2023-09-04 LAB — BASIC METABOLIC PANEL
BUN: 17 mg/dL (ref 6–23)
CO2: 27 meq/L (ref 19–32)
Calcium: 9.8 mg/dL (ref 8.4–10.5)
Chloride: 107 meq/L (ref 96–112)
Creatinine, Ser: 0.87 mg/dL (ref 0.40–1.20)
GFR: 62.24 mL/min (ref >60.00)
Glucose, Bld: 87 mg/dL (ref 70–99)
Potassium: 3.9 meq/L (ref 3.5–5.1)
Sodium: 139 meq/L (ref 135–145)

## 2023-09-17 ENCOUNTER — Ambulatory Visit (INDEPENDENT_AMBULATORY_CARE_PROVIDER_SITE_OTHER): Payer: Self-pay | Admitting: Surgical

## 2023-09-17 DIAGNOSIS — Z411 Encounter for cosmetic surgery: Secondary | ICD-10-CM

## 2023-09-17 NOTE — Progress Notes (Signed)
Botulinum Toxin Procedure Note  Procedure: Cosmetic botulinum toxin  Pre-operative Diagnosis: Dynamic rhytides  Post-operative Diagnosis: Same  Complications:  None  Brief history: The patient desires botulinum toxin injection.  She is aware of the risks including bleeding, damage to deeper structures, asymmetry, brow ptosis, eyelid ptosis, bruising. The patient understands and wishes to proceed.  Procedure: The area was prepped with alcohol and dried with a clean gauze.  Using a clean technique the botulinum toxin was diluted with 3 mL of bacteriostatic saline per 100 unit vial which resulted in 10 units per 0.1 mL.  Subsequently the mixture was injected in the glabellar, lateral canthal lines, forehead area with preservation of the temporal branch to the lateral eyebrow. A total of 125 Units of botulinum toxin was used. The forehead and glabellar area was injected with care to inject intramuscular only while holding pressure on the supratrochlear vessels in each area during each injection on either side of the medial corrugators. The injection proceeded vertically superiorly to the medial 2/3 of the frontalis muscle and superior 2/3 of the lateral frontalis, again with preservation of the frontal branch.  No complications were noted. Light pressure was held for 5 minutes. She was instructed explicitly in post-operative care.  Dysport [id number] EXP:  04/10/2024

## 2023-09-24 ENCOUNTER — Encounter: Payer: Medicare Other | Admitting: Surgical

## 2023-12-09 ENCOUNTER — Ambulatory Visit (INDEPENDENT_AMBULATORY_CARE_PROVIDER_SITE_OTHER): Payer: Self-pay | Admitting: Surgical

## 2023-12-09 DIAGNOSIS — Z411 Encounter for cosmetic surgery: Secondary | ICD-10-CM

## 2023-12-09 NOTE — Progress Notes (Signed)
Botulinum Toxin and Filler Injection Procedure Note  Procedure: Filler administration  Pre-operative Diagnosis: Nasolabial folds  Post-operative Diagnosis: Same  Complications:  None  Brief history: The patient desires filler injection of her nasolabial folds and upper lip lines  I discussed with the patient this proposed procedure of filler injections.  We discussed filler injections or customize depending on the particular needs of the patient, it is performed on various locations of the face for temporary correction.   We discussed the risks including bleeding, scarring, formation of a granuloma, infection, asymmetry, vascular occlusion resulting in skin necrosis, vascular occlusion resulting in blindness.   Procedure: The area was prepped with alcohol and dried with a clean gauze.  The nasolabial fold were then injected proceeding in a superior to inferior technique.  The filler was injected in the mid to deep dermis.  Aspiration was performed with each injection to ensure needle was not placed within a vessel.  The nasolabial folds were injected bilaterally, patient tolerated this well there was no complications.    Attention was then turned to the upper lip lines and lines at the corner of her mouth.  Upper lip lines were injected in a retrograde pattern, aspiration was performed with each injection to ensure needle was not placed within a vessel.  Patient had excellent results post procedure.  A total of 0.7 cc was injected between the nasolabial folds, upper lip folds and lateral lines of the mouth.  Patient was instructed in postoperative care.  All of her questions were answered to her content.  We did discuss recommendations for follow-up in 1 week to discuss results and possible follow-up in 2 weeks in person.  Restylane Refyne 1 cc- Exp 01/08/2025 Lot [id number]

## 2023-12-16 ENCOUNTER — Ambulatory Visit (INDEPENDENT_AMBULATORY_CARE_PROVIDER_SITE_OTHER): Payer: Medicare Other | Admitting: Surgical

## 2023-12-16 DIAGNOSIS — Z411 Encounter for cosmetic surgery: Secondary | ICD-10-CM

## 2023-12-16 NOTE — Progress Notes (Signed)
 Patient is a 83 year old female here for telephone visit to discuss filler to her lips and nasolabial fold area about 1 week ago.  She reports she is doing really well, she is very happy with the result.  She reports that she looks much younger and is very happy about this.  She is not having any issues or questions or concerns.  She does report that she has a bruise that is slowly improving, otherwise no issues.  The patient gave consent to have this visit done by telemedicine / virtual visit, two identifiers were used to identify patient. This is also consent for access the chart and treat the patient via this visit. The patient is located in [place] .  I, the provider, am at the office.  We spent 3 minutes together for the visit.  Joined by phone.  Recommend calling with questions or concerns. We will plan to see her back as needed

## 2024-01-16 ENCOUNTER — Ambulatory Visit (INDEPENDENT_AMBULATORY_CARE_PROVIDER_SITE_OTHER): Payer: Self-pay | Admitting: Surgical

## 2024-01-16 DIAGNOSIS — Z411 Encounter for cosmetic surgery: Secondary | ICD-10-CM

## 2024-01-16 NOTE — Progress Notes (Signed)
 Botulinum Toxin Procedure Note  Procedure: Cosmetic botulinum toxin  Pre-operative Diagnosis: Dynamic rhytides  Post-operative Diagnosis: Same  Complications:  None  Brief history: The patient desires botulinum toxin injection.  She is aware of the risks including bleeding, damage to deeper structures, asymmetry, brow ptosis, eyelid ptosis, bruising. The patient understands and wishes to proceed.  Procedure: The area was prepped with alcohol and dried with a clean gauze.  Using a clean technique the botulinum toxin was diluted with 2.5 mL of bacteriostatic saline per 100 unit vial which resulted in 4 units per 0.1 mL.  Subsequently the mixture was injected in the glabellar, lateral canthal lines, forehead area with preservation of the temporal branch to the lateral eyebrow. A total of 32 Units of botulinum toxin was used. The forehead and glabellar area was injected with care to inject intramuscular only while holding pressure on the supratrochlear vessels in each area during each injection on either side of the medial corrugators. The injection proceeded vertically superiorly to the medial 2/3 of the frontalis muscle and superior 2/3 of the lateral frontalis, again with preservation of the frontal branch.  No complications were noted. Light pressure was held for 5 minutes. She was instructed explicitly in post-operative care.  Botox LOT:  [id number] C2 EXP:  12/2025

## 2024-02-10 ENCOUNTER — Other Ambulatory Visit: Payer: Self-pay | Admitting: Nurse Practitioner

## 2024-02-10 DIAGNOSIS — I10 Essential (primary) hypertension: Secondary | ICD-10-CM

## 2024-02-11 ENCOUNTER — Other Ambulatory Visit: Payer: Self-pay | Admitting: Nurse Practitioner

## 2024-02-11 DIAGNOSIS — G43701 Chronic migraine without aura, not intractable, with status migrainosus: Secondary | ICD-10-CM

## 2024-02-11 DIAGNOSIS — I10 Essential (primary) hypertension: Secondary | ICD-10-CM

## 2024-02-11 MED ORDER — BUTALBITAL-APAP-CAFFEINE 50-300-40 MG PO CAPS: ORAL_CAPSULE | ORAL | 0 refills | Status: AC

## 2024-02-11 NOTE — Telephone Encounter (Signed)
 Patient needs to be scheduled for a CPE please

## 2024-02-11 NOTE — Telephone Encounter (Signed)
 Patient is moving to [place] will be leaving on 02/20/24

## 2024-02-11 NOTE — Telephone Encounter (Signed)
 Cancelled reorder request for amlodipine due to it is a duplicate request. See refill request dated yesterday:  Interface, [employer]s Out routed this conversation to [employer]

## 2024-02-11 NOTE — Telephone Encounter (Signed)
 Copied from CRM [id number]. Topic: Clinical - Medication Refill >> Feb 11, 2024 12:17 PM Tiffany S wrote: Most Recent Primary Care Visit:  Provider: [facility]  Department: [facility]  Visit Type: LAB  Date: 09/04/2023  Medication: amLODipine (NORVASC) 2.5 MG tablet Butalbital-APAP-Caffeine 50-300-40 MG CAPS  Has the patient contacted their pharmacy? Yes (Agent: If no, request that the patient contact the pharmacy for the refill. If patient does not wish to contact the pharmacy document the reason why and proceed with request.) (Agent: If yes, when and what did the pharmacy advise?)  Is this the correct pharmacy for this prescription? Yes If no, delete pharmacy and type the correct one.  This is the patient's preferred pharmacy:   [facility] [phone] [facility], [place] - [address] [address] Phone: [phone] Fax: [phone]   Has the prescription been filled recently? Yes  Is the patient out of the medication? Yes  Has the patient been seen for an appointment in the last year OR does the patient have an upcoming appointment? Yes  Can we respond through MyChart? Yes  Agent: Please be advised that Rx refills may take up to 3 business days. We ask that you follow-up with your pharmacy.

## 2024-02-14 ENCOUNTER — Other Ambulatory Visit: Payer: Self-pay | Admitting: Nurse Practitioner

## 2024-02-14 DIAGNOSIS — G43701 Chronic migraine without aura, not intractable, with status migrainosus: Secondary | ICD-10-CM

## 2024-09-30 ENCOUNTER — Telehealth: Payer: Self-pay | Admitting: Nurse Practitioner

## 2024-09-30 NOTE — Telephone Encounter (Signed)
 Per last cancellation not patient has moved to [place].  [facility] Care Guide [facility] TEAM Direct Dial: [phone]

## 2024-10-21 NOTE — Telephone Encounter (Signed)
 PCP removed.
